# Patient Record
Sex: Female | Born: 1935 | Race: White | Hispanic: No | Smoking: Never smoker
Health system: Southern US, Community
[De-identification: ages and names within clinical notes are randomized; demographics above are authoritative.]

## PROBLEM LIST (undated history)

## (undated) DIAGNOSIS — I10 Essential (primary) hypertension: Secondary | ICD-10-CM

## (undated) DIAGNOSIS — M8580 Other specified disorders of bone density and structure, unspecified site: Secondary | ICD-10-CM

## (undated) DIAGNOSIS — K56609 Unspecified intestinal obstruction, unspecified as to partial versus complete obstruction: Secondary | ICD-10-CM

## (undated) DIAGNOSIS — E785 Hyperlipidemia, unspecified: Secondary | ICD-10-CM

## (undated) DIAGNOSIS — H409 Unspecified glaucoma: Secondary | ICD-10-CM

## (undated) DIAGNOSIS — F419 Anxiety disorder, unspecified: Secondary | ICD-10-CM

## (undated) HISTORY — DX: Anxiety disorder, unspecified: F41.9

## (undated) HISTORY — DX: Other specified disorders of bone density and structure, unspecified site: M85.80

## (undated) HISTORY — DX: Hyperlipidemia, unspecified: E78.5

## (undated) HISTORY — PX: OTHER SURGICAL HISTORY: SHX169

## (undated) HISTORY — DX: Unspecified glaucoma: H40.9

## (undated) HISTORY — DX: Unspecified intestinal obstruction, unspecified as to partial versus complete obstruction: K56.609

## (undated) HISTORY — PX: HERNIA REPAIR: SHX51

## (undated) HISTORY — DX: Essential (primary) hypertension: I10

## (undated) HISTORY — PX: ABDOMINAL HYSTERECTOMY: SHX81

---

## 1998-10-17 ENCOUNTER — Other Ambulatory Visit: Admission: RE | Admit: 1998-10-17 | Discharge: 1998-10-17 | Payer: Self-pay | Admitting: Internal Medicine

## 1999-06-16 ENCOUNTER — Encounter: Payer: Self-pay | Admitting: Emergency Medicine

## 1999-06-16 ENCOUNTER — Inpatient Hospital Stay (HOSPITAL_COMMUNITY): Admission: EM | Admit: 1999-06-16 | Discharge: 1999-06-19 | Payer: Self-pay | Admitting: Emergency Medicine

## 1999-06-19 ENCOUNTER — Inpatient Hospital Stay
Admission: EM | Admit: 1999-06-19 | Discharge: 1999-06-26 | Payer: Self-pay | Admitting: Physical Medicine and Rehabilitation

## 1999-06-22 ENCOUNTER — Ambulatory Visit (HOSPITAL_COMMUNITY)
Admission: RE | Admit: 1999-06-22 | Discharge: 1999-06-22 | Payer: Self-pay | Admitting: Physical Medicine and Rehabilitation

## 1999-10-14 ENCOUNTER — Other Ambulatory Visit: Admission: RE | Admit: 1999-10-14 | Discharge: 1999-10-14 | Payer: Self-pay | Admitting: Internal Medicine

## 1999-10-23 ENCOUNTER — Encounter: Admission: RE | Admit: 1999-10-23 | Discharge: 1999-10-23 | Payer: Self-pay | Admitting: Internal Medicine

## 1999-10-23 ENCOUNTER — Encounter: Payer: Self-pay | Admitting: Internal Medicine

## 2000-03-10 ENCOUNTER — Encounter: Admission: RE | Admit: 2000-03-10 | Discharge: 2000-03-10 | Payer: Self-pay | Admitting: Internal Medicine

## 2000-03-10 ENCOUNTER — Encounter: Payer: Self-pay | Admitting: Internal Medicine

## 2000-10-25 ENCOUNTER — Encounter: Admission: RE | Admit: 2000-10-25 | Discharge: 2000-10-25 | Payer: Self-pay | Admitting: Internal Medicine

## 2000-10-25 ENCOUNTER — Encounter: Payer: Self-pay | Admitting: Internal Medicine

## 2001-09-05 ENCOUNTER — Encounter: Payer: Self-pay | Admitting: Internal Medicine

## 2001-09-05 ENCOUNTER — Encounter: Admission: RE | Admit: 2001-09-05 | Discharge: 2001-09-05 | Payer: Self-pay | Admitting: Internal Medicine

## 2001-09-15 ENCOUNTER — Encounter: Admission: RE | Admit: 2001-09-15 | Discharge: 2001-09-15 | Payer: Self-pay | Admitting: Internal Medicine

## 2001-09-15 ENCOUNTER — Encounter: Payer: Self-pay | Admitting: Internal Medicine

## 2001-10-24 ENCOUNTER — Encounter: Admission: RE | Admit: 2001-10-24 | Discharge: 2001-10-24 | Payer: Self-pay | Admitting: Internal Medicine

## 2001-10-24 ENCOUNTER — Encounter: Payer: Self-pay | Admitting: Internal Medicine

## 2002-06-19 ENCOUNTER — Encounter: Payer: Self-pay | Admitting: Internal Medicine

## 2002-06-19 ENCOUNTER — Encounter: Admission: RE | Admit: 2002-06-19 | Discharge: 2002-06-19 | Payer: Self-pay | Admitting: Internal Medicine

## 2002-11-15 ENCOUNTER — Encounter: Payer: Self-pay | Admitting: Internal Medicine

## 2002-11-15 ENCOUNTER — Encounter: Admission: RE | Admit: 2002-11-15 | Discharge: 2002-11-15 | Payer: Self-pay | Admitting: Internal Medicine

## 2003-11-15 ENCOUNTER — Encounter: Payer: Self-pay | Admitting: Cardiology

## 2003-11-15 ENCOUNTER — Inpatient Hospital Stay (HOSPITAL_COMMUNITY): Admission: EM | Admit: 2003-11-15 | Discharge: 2003-11-24 | Payer: Self-pay | Admitting: Emergency Medicine

## 2003-11-16 ENCOUNTER — Encounter (INDEPENDENT_AMBULATORY_CARE_PROVIDER_SITE_OTHER): Payer: Self-pay | Admitting: *Deleted

## 2004-01-09 ENCOUNTER — Encounter: Admission: RE | Admit: 2004-01-09 | Discharge: 2004-01-09 | Payer: Self-pay | Admitting: Internal Medicine

## 2004-01-17 ENCOUNTER — Encounter: Admission: RE | Admit: 2004-01-17 | Discharge: 2004-01-17 | Payer: Self-pay | Admitting: Internal Medicine

## 2004-07-02 ENCOUNTER — Ambulatory Visit (HOSPITAL_COMMUNITY): Admission: RE | Admit: 2004-07-02 | Discharge: 2004-07-02 | Payer: Self-pay

## 2004-08-15 ENCOUNTER — Inpatient Hospital Stay (HOSPITAL_COMMUNITY): Admission: RE | Admit: 2004-08-15 | Discharge: 2004-08-20 | Payer: Self-pay | Admitting: General Surgery

## 2004-08-22 ENCOUNTER — Emergency Department (HOSPITAL_COMMUNITY): Admission: EM | Admit: 2004-08-22 | Discharge: 2004-08-23 | Payer: Self-pay | Admitting: Emergency Medicine

## 2004-09-29 ENCOUNTER — Encounter: Admission: RE | Admit: 2004-09-29 | Discharge: 2004-09-29 | Payer: Self-pay | Admitting: Internal Medicine

## 2004-11-13 ENCOUNTER — Encounter: Admission: RE | Admit: 2004-11-13 | Discharge: 2004-11-13 | Payer: Self-pay | Admitting: Internal Medicine

## 2004-12-10 ENCOUNTER — Encounter: Admission: RE | Admit: 2004-12-10 | Discharge: 2004-12-10 | Payer: Self-pay | Admitting: Internal Medicine

## 2005-03-05 ENCOUNTER — Encounter: Admission: RE | Admit: 2005-03-05 | Discharge: 2005-03-05 | Payer: Self-pay | Admitting: Internal Medicine

## 2005-05-22 IMAGING — CR DG CHEST 2V
2 series · 2 of 2 positions shown · non-contrast
Comparison: 01/09/04.

CLINICAL DATA: Recent ventral hernia repair.  Shortness of breath and chest pain.
 TWO VIEW CHEST:

[view not recorded (1 of 2)]
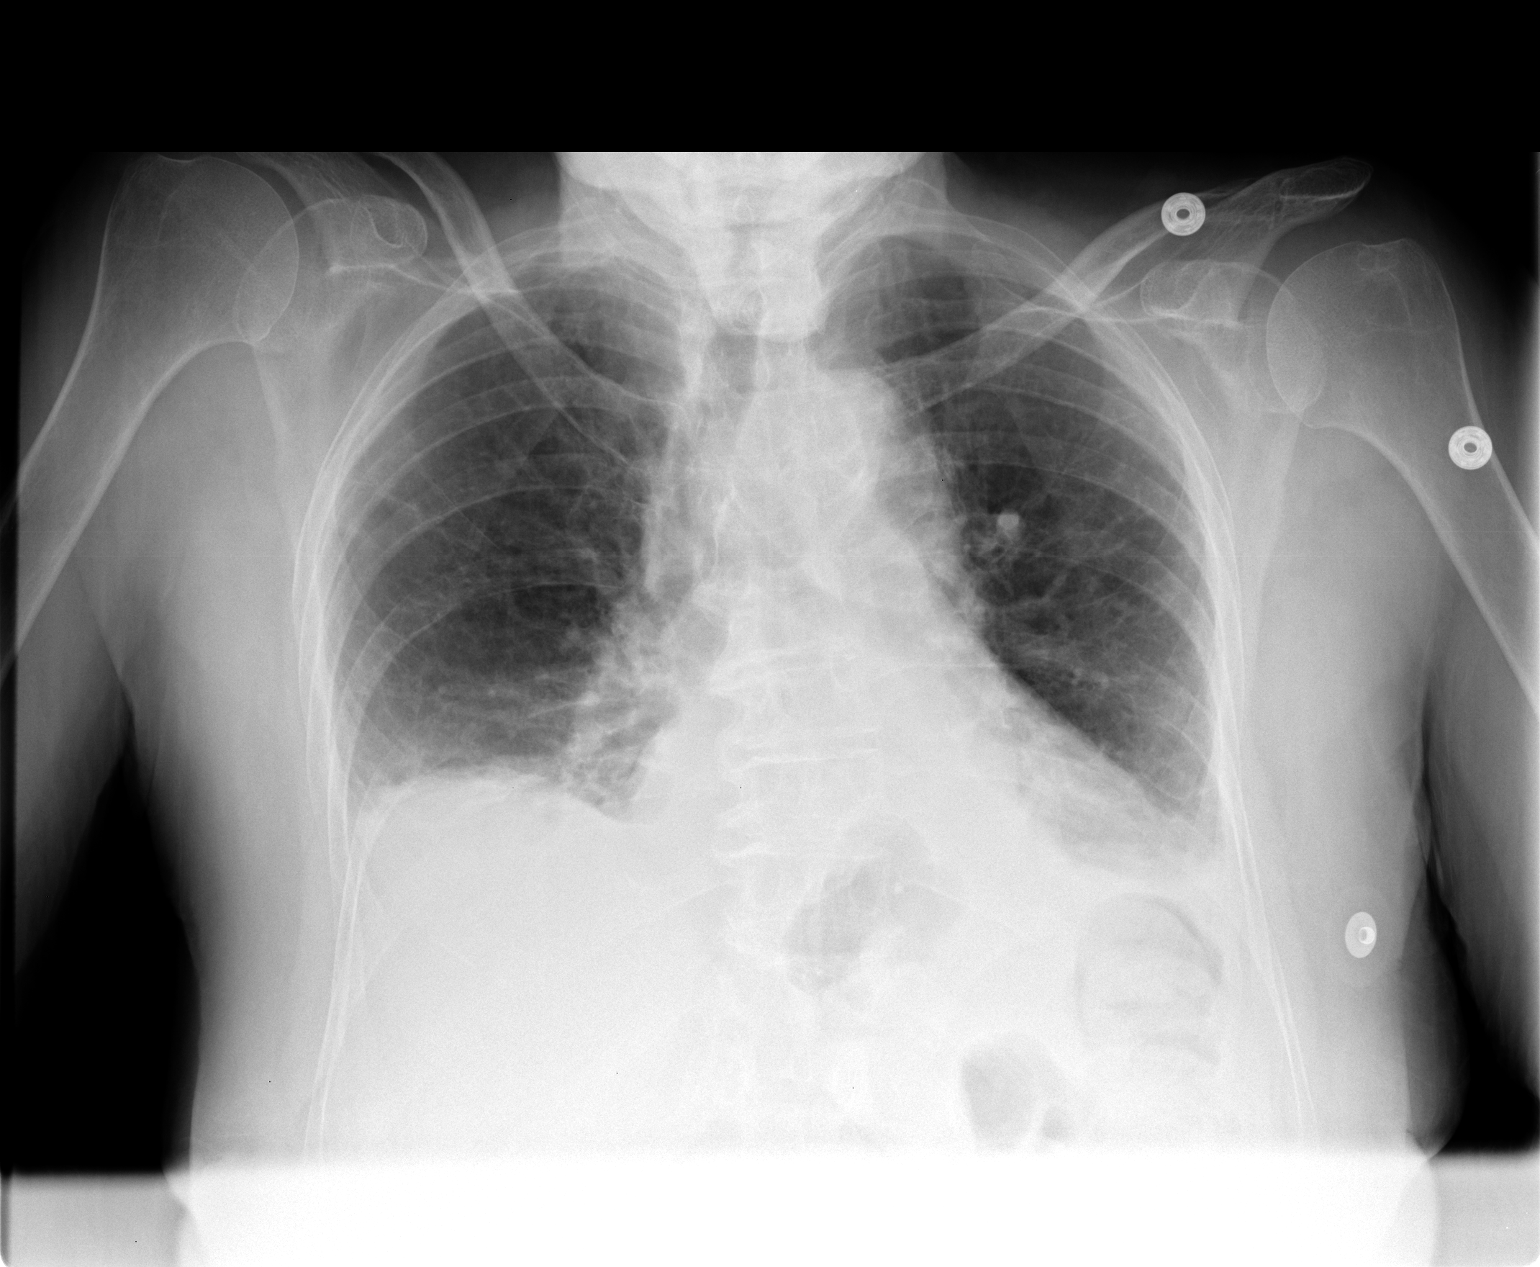

[view not recorded (2 of 2)]
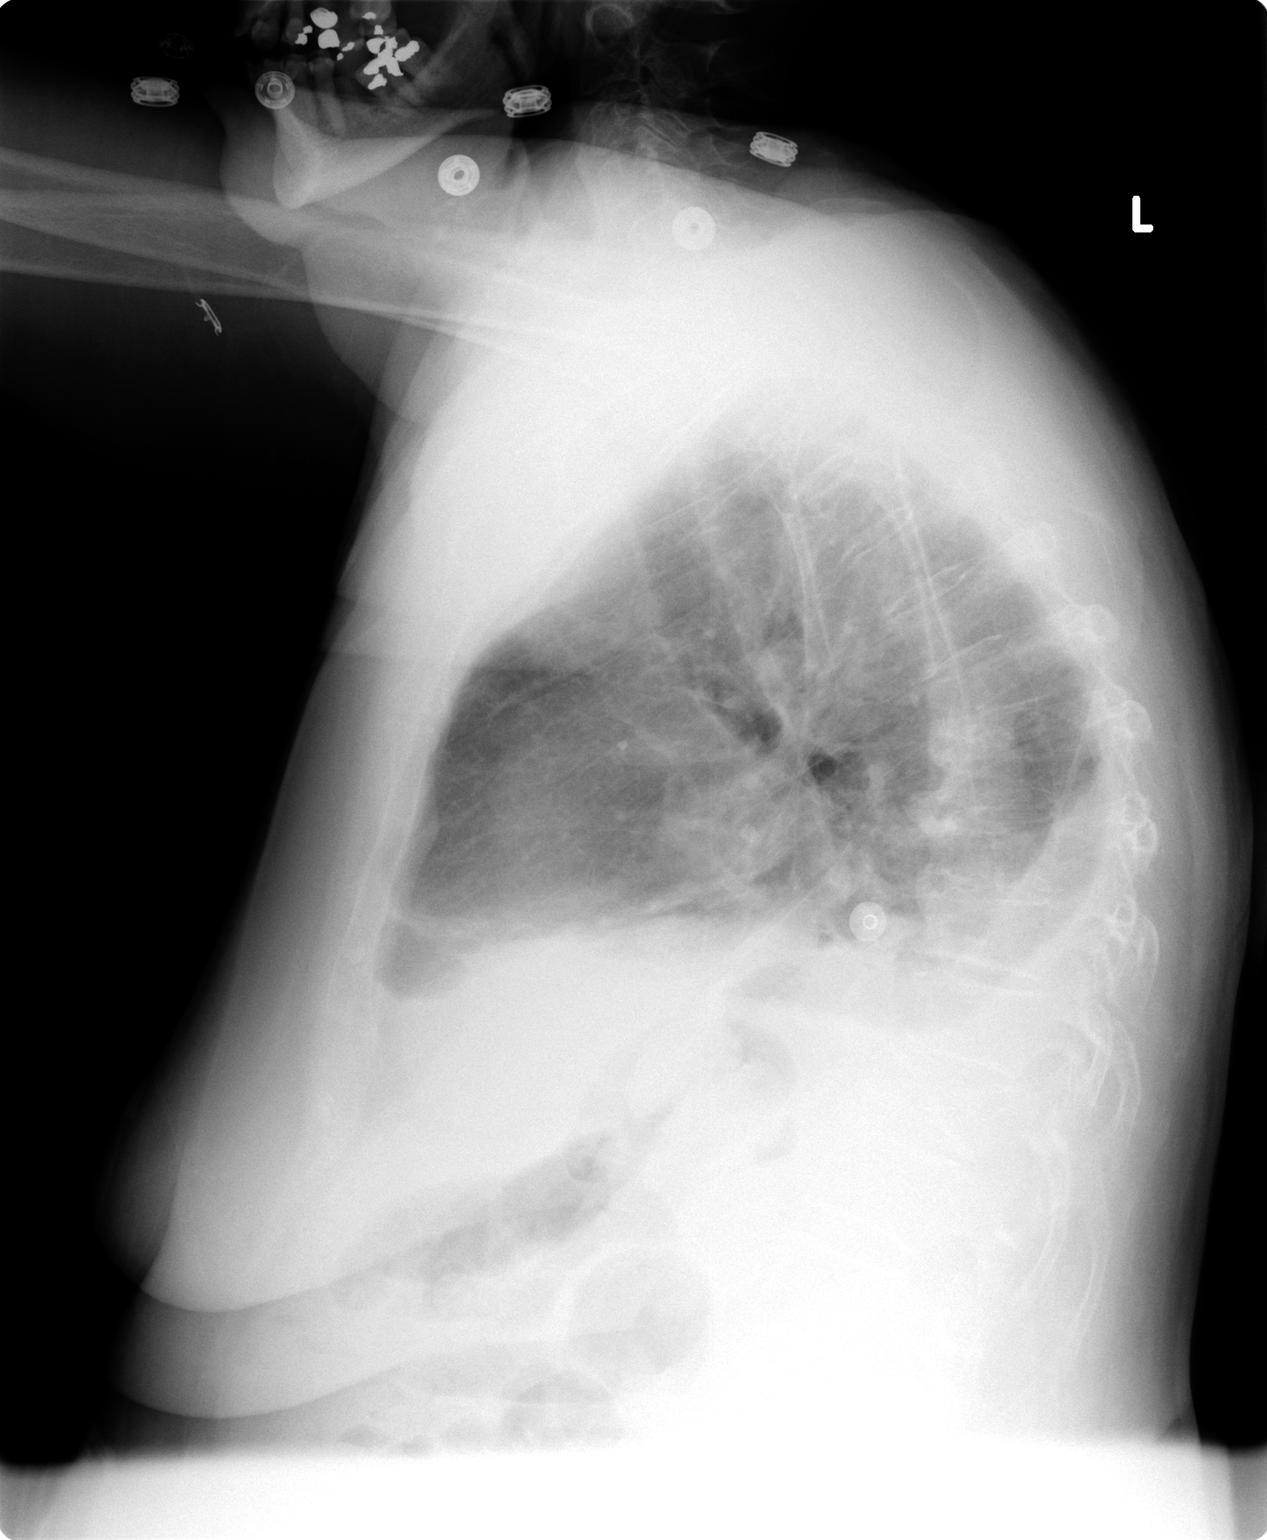

[2 of 2 positions shown; findings below may reference images not displayed]

Two view exam of the chest shows interval decrease in lung volumes with bibasilar atelectasis and small bilateral pleural effusions.  Cardio/pericardial silhouette is enlarged and accentuated by the lower volume film.  Bones are diffusely osteopenic.
IMPRESSION: 1.  Cardiomegaly with bibasilar atelectasis and small bilateral pleural effusions.  
 2.  No overt pulmonary edema.

## 2005-07-05 IMAGING — CR DG CHEST 2V
2 series · 2 of 2 positions shown · non-contrast
Comparison: 2 view chest x-ray 08/22/2004 [HOSPITAL].

CLINICAL DATA: Followup pleural effusions. Shortness of breath.

CHEST - 2 VIEW 09/29/2004:

[view not recorded (1 of 2)]
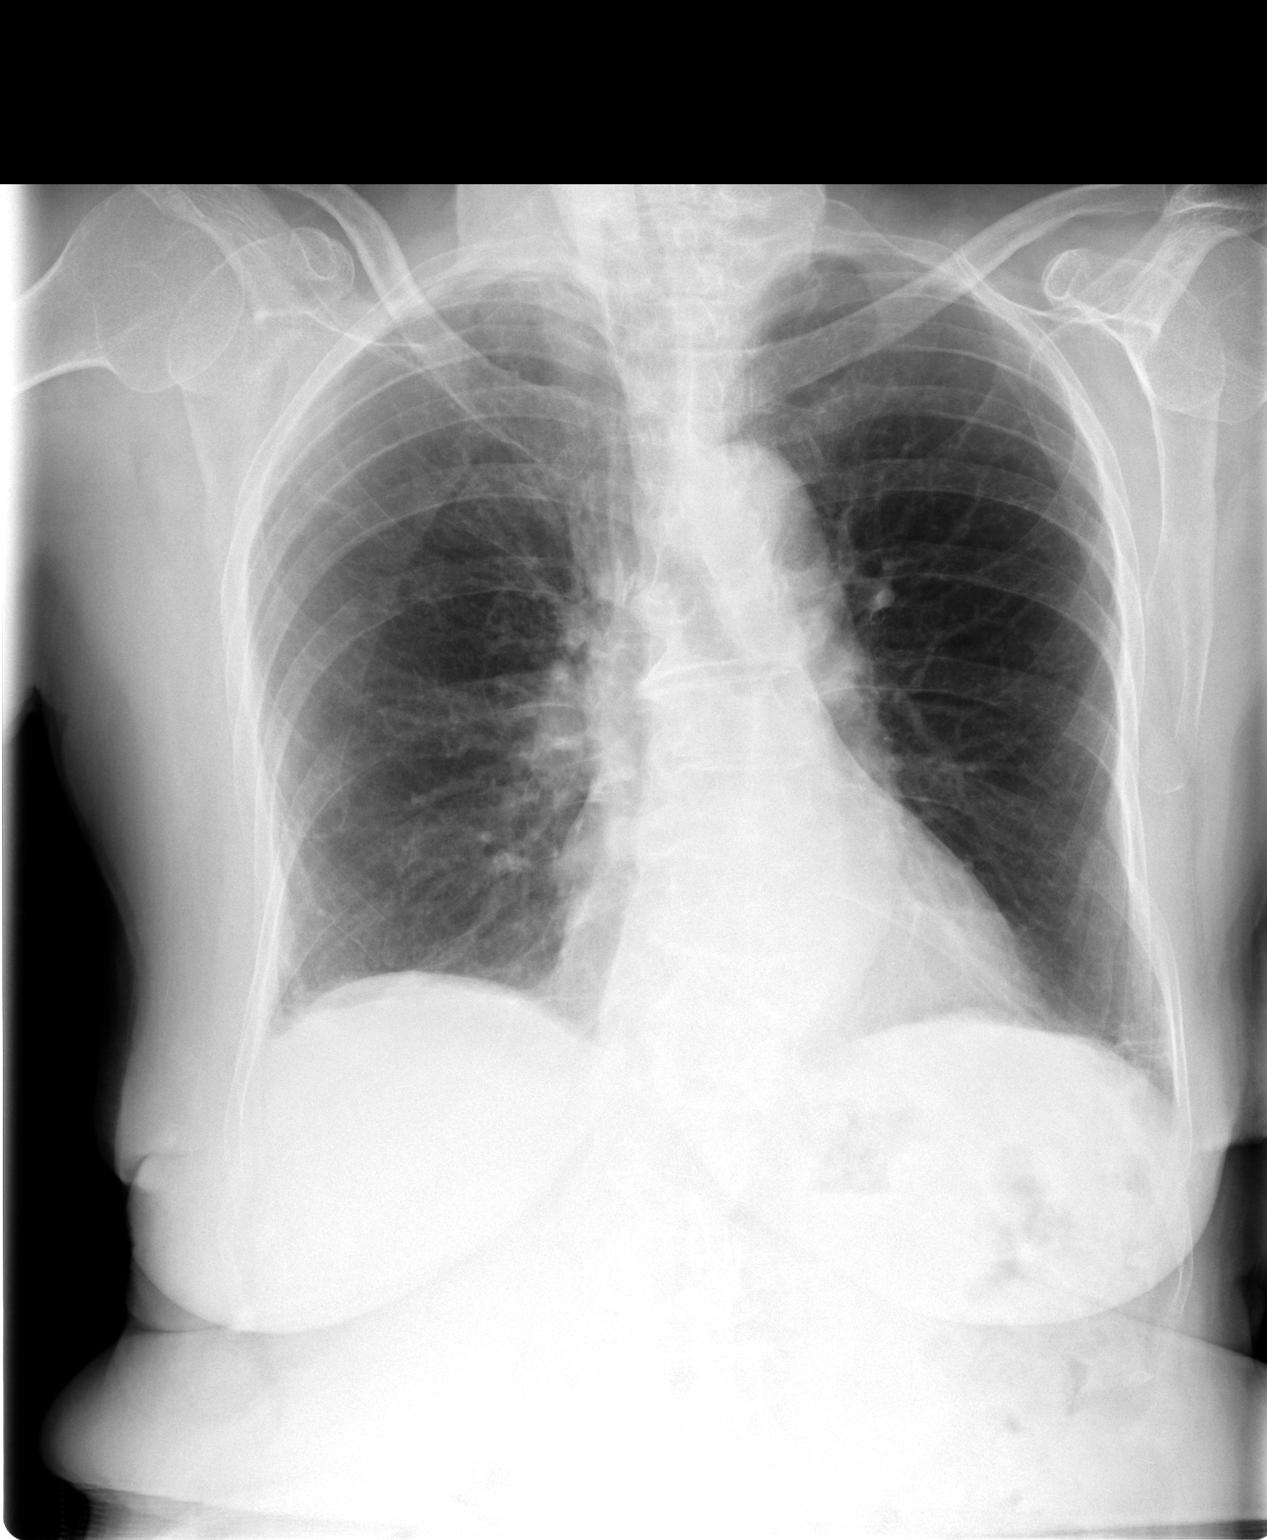

[view not recorded (2 of 2)]
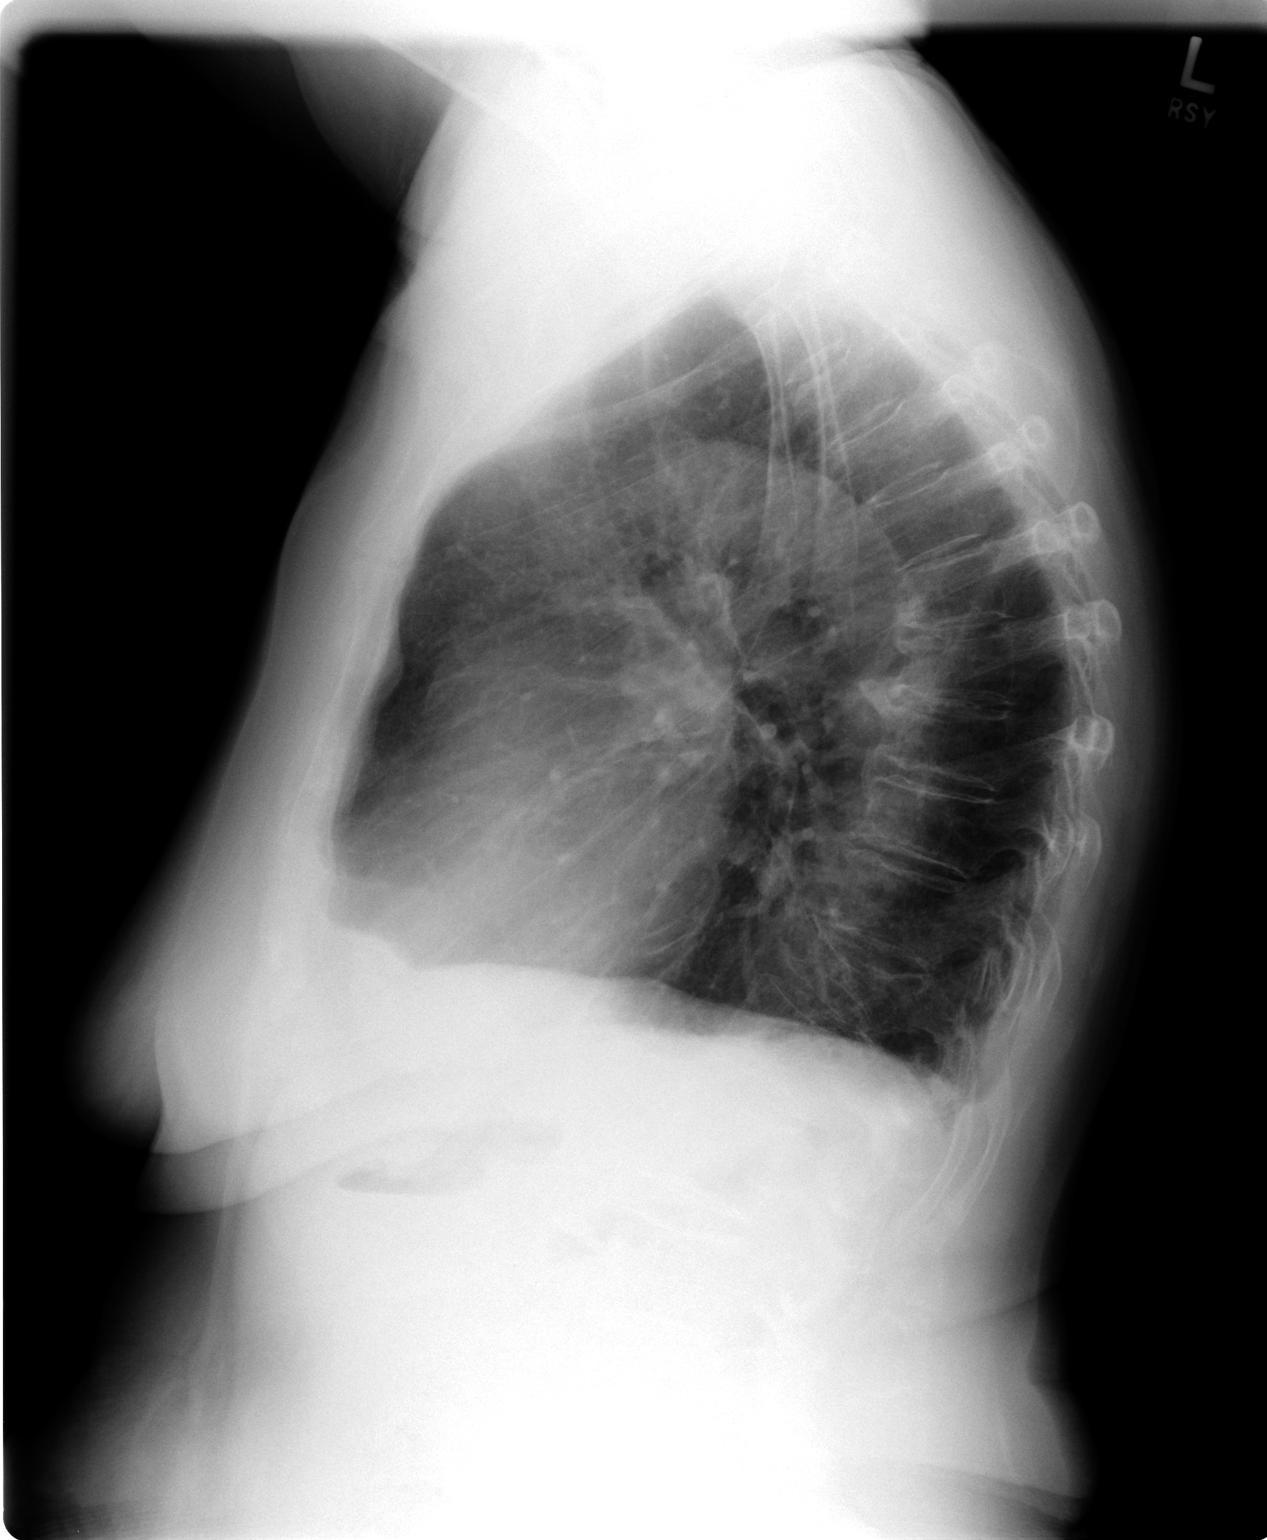

[2 of 2 positions shown; findings below may reference images not displayed]

FINDINGS: Since the previous examination, the bilateral pleural effusions have
improved, with only very small effusions persisting. Aeration of the lung bases
has also improved. There is minimal residual right basilar atelectasis. Linear
scarring is noted in the left base. The lungs appear clear otherwise. The heart
is mildly enlarged but stable. The thoracic aorta is atherosclerotic and
tortuous but unchanged. The hilar and mediastinal contours are otherwise
unremarkable. Mild thoracic scoliosis convex left is noted and there are
degenerative changes in the midthoracic spine.
IMPRESSION: Improvement in the bilateral pleural effusions and associated atelectasis in the
lower lobes since 08/22/2004. Very small effusions persist as does mild
atelectasis in the right lower lobe. No new abnormalities.

## 2005-12-17 ENCOUNTER — Encounter: Admission: RE | Admit: 2005-12-17 | Discharge: 2005-12-17 | Payer: Self-pay | Admitting: Internal Medicine

## 2005-12-21 ENCOUNTER — Encounter: Admission: RE | Admit: 2005-12-21 | Discharge: 2006-01-14 | Payer: Self-pay | Admitting: Internal Medicine

## 2006-05-05 ENCOUNTER — Encounter: Admission: RE | Admit: 2006-05-05 | Discharge: 2006-05-05 | Payer: Self-pay | Admitting: Internal Medicine

## 2007-04-21 ENCOUNTER — Encounter: Admission: RE | Admit: 2007-04-21 | Discharge: 2007-04-21 | Payer: Self-pay | Admitting: Internal Medicine

## 2007-06-13 ENCOUNTER — Encounter: Admission: RE | Admit: 2007-06-13 | Discharge: 2007-06-13 | Payer: Self-pay | Admitting: Internal Medicine

## 2008-04-29 ENCOUNTER — Ambulatory Visit: Payer: Self-pay | Admitting: Internal Medicine

## 2008-07-16 ENCOUNTER — Encounter: Admission: RE | Admit: 2008-07-16 | Discharge: 2008-07-16 | Payer: Self-pay | Admitting: Internal Medicine

## 2008-09-05 ENCOUNTER — Ambulatory Visit: Payer: Self-pay | Admitting: Internal Medicine

## 2008-10-29 ENCOUNTER — Ambulatory Visit: Payer: Self-pay | Admitting: Internal Medicine

## 2008-11-06 ENCOUNTER — Emergency Department (HOSPITAL_COMMUNITY): Admission: EM | Admit: 2008-11-06 | Discharge: 2008-11-06 | Payer: Self-pay | Admitting: Emergency Medicine

## 2008-11-26 ENCOUNTER — Ambulatory Visit: Payer: Self-pay | Admitting: Internal Medicine

## 2009-03-06 ENCOUNTER — Ambulatory Visit: Payer: Self-pay | Admitting: Internal Medicine

## 2009-05-13 ENCOUNTER — Ambulatory Visit: Payer: Self-pay | Admitting: Internal Medicine

## 2009-05-13 ENCOUNTER — Encounter: Admission: RE | Admit: 2009-05-13 | Discharge: 2009-05-13 | Payer: Self-pay | Admitting: Internal Medicine

## 2009-05-16 ENCOUNTER — Ambulatory Visit: Payer: Self-pay | Admitting: Internal Medicine

## 2009-05-19 ENCOUNTER — Ambulatory Visit: Payer: Self-pay | Admitting: Internal Medicine

## 2009-05-23 ENCOUNTER — Ambulatory Visit: Payer: Self-pay | Admitting: Internal Medicine

## 2009-06-06 ENCOUNTER — Ambulatory Visit: Payer: Self-pay | Admitting: Internal Medicine

## 2009-08-01 ENCOUNTER — Encounter: Admission: RE | Admit: 2009-08-01 | Discharge: 2009-08-01 | Payer: Self-pay | Admitting: Internal Medicine

## 2009-08-01 ENCOUNTER — Ambulatory Visit: Payer: Self-pay | Admitting: Internal Medicine

## 2009-08-13 ENCOUNTER — Encounter: Admission: RE | Admit: 2009-08-13 | Discharge: 2009-08-13 | Payer: Self-pay | Admitting: Internal Medicine

## 2009-08-13 LAB — HM MAMMOGRAPHY

## 2009-08-19 ENCOUNTER — Encounter: Admission: RE | Admit: 2009-08-19 | Discharge: 2009-10-13 | Payer: Self-pay | Admitting: Internal Medicine

## 2009-09-05 ENCOUNTER — Ambulatory Visit: Payer: Self-pay | Admitting: Internal Medicine

## 2009-09-05 ENCOUNTER — Encounter: Admission: RE | Admit: 2009-09-05 | Discharge: 2009-09-05 | Payer: Self-pay | Admitting: Internal Medicine

## 2009-09-08 ENCOUNTER — Encounter: Admission: RE | Admit: 2009-09-08 | Discharge: 2009-09-08 | Payer: Self-pay | Admitting: Internal Medicine

## 2009-09-08 ENCOUNTER — Ambulatory Visit: Payer: Self-pay | Admitting: Internal Medicine

## 2009-09-29 ENCOUNTER — Ambulatory Visit: Payer: Self-pay | Admitting: Internal Medicine

## 2009-10-07 ENCOUNTER — Ambulatory Visit: Payer: Self-pay | Admitting: Internal Medicine

## 2010-10-08 ENCOUNTER — Ambulatory Visit (INDEPENDENT_AMBULATORY_CARE_PROVIDER_SITE_OTHER): Payer: Medicare Other | Admitting: Internal Medicine

## 2010-10-08 DIAGNOSIS — J040 Acute laryngitis: Secondary | ICD-10-CM

## 2010-10-27 ENCOUNTER — Ambulatory Visit (INDEPENDENT_AMBULATORY_CARE_PROVIDER_SITE_OTHER): Payer: Medicare Other | Admitting: Internal Medicine

## 2010-10-27 ENCOUNTER — Ambulatory Visit
Admission: RE | Admit: 2010-10-27 | Discharge: 2010-10-27 | Disposition: A | Discharge status: Home / Self Care | Payer: Medicare Other | Source: Ambulatory Visit | Attending: Internal Medicine | Admitting: Internal Medicine

## 2010-10-27 ENCOUNTER — Other Ambulatory Visit: Payer: Self-pay | Admitting: Internal Medicine

## 2010-10-27 DIAGNOSIS — R1110 Vomiting, unspecified: Secondary | ICD-10-CM

## 2010-10-27 DIAGNOSIS — K5900 Constipation, unspecified: Secondary | ICD-10-CM
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
## 2010-11-20 NOTE — Op Note (Signed)
NAME:  Dominique [name], Dominique [name]                    ACCOUNT NO.:  [id number]   MEDICAL RECORD NO.:  [id number]          PATIENT TYPE:  OBV   LOCATION:  0098                         FACILITY:  [facility]   PHYSICIAN:  Adolph Pollack, M.D.DATE OF BIRTH:  05-Aug-1913   DATE OF PROCEDURE:  08/14/2004  DATE OF DISCHARGE:                                 OPERATIVE REPORT   PREOPERATIVE DIAGNOSIS:  Giant ventral incisional hernia.   POSTOPERATIVE DIAGNOSIS:  Giant ventral incisional hernia.   PROCEDURE:  Laparoscopic converted to open ventral incisional hernia repair  with mesh.   SURGEON:  Dr. Abbey Chatters   ASSISTANT:  Dr. Orson Slick   ANESTHESIA:  General.   INDICATION:  [name] is an 77 year old female, who had an emergency  exploratory laparotomy and small bowel resection in May 2005 by Dr.  Magnus Ivan.  She developed a ventral incisional hernia.  It has been somewhat  symptomatic.  She now presents for elective repair.  The procedure and risks  were discussed with her preoperatively.   TECHNIQUE:  She was seen in the holding area and then brought to the  operating room, placed supine on the operating table, and a general  anesthetic was administered.  A Foley catheter placed in the bladder.  The  abdominal wall was sterilely prepped and draped.  In the right upper  quadrant laterally, an incision was made through the skin, subcutaneous  tissue, and fascia.  The peritoneal cavity was entered bluntly.  A  pursestring suture of 0 Vicryl was placed around the fascial edges.  A  pursestring suture of 0 Vicryl was placed around the fascial edges.  The  Hasson trocar was introduced into the peritoneal cavity, and  pneumoperitoneum was created by insufflation of CO2 gas.   Next, the laparoscope was introduced.  There were fairly dense omental  adhesion and small intestinal adhesions to the midline abdominal wall.  I  placed a 5 mm trocar in the right lower quadrant under direct vision.  I  began to try to  manipulate the adhesions, but they were very densely  adherent, especially the intestinal lesions.  At this point, I did not feel  I could proceed with the laparoscopic repair and aborted it.  The  laparoscopic instruments were removed.  The trocars were removed; the right  upper quadrant fascial defect was closed by tightening up and tying down the  pursestring suture.   Following this, I reincised her lower midline incision and extended it above  the umbilicus.  There was very thin tissue.  I carried this down through the  thin scar and entered the peritoneal cavity through a large hernia sac.  There were fairly dense adhesions between the omentum and the posterior  abdominal wall which were taken down sharply and with cautery.  There were  also very dense adhesions between the colon and small bowel and abdominal,  taken down sharply.  A few serosal tears were made, and these were repaired  with interrupted 3-0 Vicryl suture.  After approximately an hour of  adhesiolysis, I had all the adhesions  removed from the abdominal wall and  exposed a very large hernia, measuring greater than 100 sq cm.  I raised the  musculofascial flaps in all directions.  I then primarily closed the hernia  with a running #1 Novofil suture.  Following this, a piece of onlay  polyester mesh was placed over the repair and anchored to the fascia  approximately 6 cm lateral, superior, and inferior to the primary repair  using running 0 Prolene suture.  This provided more than adequate coverage  of the defect as well as adequate overlap.  There was no tension on the  repair.  There was a little bit of bunching inferiorly with the primary  repair that had been performed.   The area was then irrigated, and no bleeding was noted.  The previous 5 mm  wound in the right lower quadrant was used to place a drain into the  subcutaneous space.  Another stab wound was made in the left lower quadrant,  and a second drain was  placed in the subcutaneous space.  The drains were  then anchored to the skin with interrupted 3-0 nylon suture.   The subcutaneous tissue was then closed over the mesh and drains with  running Vicryl suture.  The skin incisions were then closed with staples.  And the drains were attached to suction.  Sterile dressings were applied.   She tolerated the procedure well without any apparent complications.  She  subsequently was extubated and taken to the recovery room in satisfactory  condition.      TJR/MEDQ  D:  08/14/2004  T:  08/14/2004  Job:  [id number]   cc:   Luanna Cole. Lenord Fellers, M.D.  [address], [address]  [place]  [place] [address]  Fax: [phone]
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
## 2010-11-20 NOTE — Consult Note (Signed)
NAME:  Dominique [name], Dominique [name]                    ACCOUNT NO.:  [id number]   MEDICAL RECORD NO.:  [id number]          PATIENT TYPE:  INP   LOCATION:  0355                         FACILITY:  [facility]   PHYSICIAN:  Danae Chen, M.D.DATE OF BIRTH:  [birth year]   DATE OF CONSULTATION:  08/18/2004  DATE OF DISCHARGE:                                   CONSULTATION   PRIMARY PHYSICIAN REQUESTING CONSULTATION:  Dr. Avel Peace, [facility].   REASON FOR CONSULTATION:  Wheezing.   HISTORY OF PRESENT HOSPITAL COURSE:  The patient is a pleasant 77 year old  elderly white female who is admitted for repair of giant ventral incisional  hernia.  She has only history of mild hypertension, constipation, and  osteopenia.  Following her surgery, she has had intermittent episodes of  continuing dyspnea with some wheezing that has been noted.  She has been  evaluated for pulmonary with spiral CT which did not reveal evidence of  such; neither did it reveal any evidence of overt heart failure or edema  that would be contributing to her wheezing.  On exam, her wheezing appears  to be only upper airway related with clear lung fields.  On questioning, the  patient reports that she has had upper airway wheezing for quite some time  and usually exacerbated by lying in bed or with heartburn.   PERTINENT LABORATORY:  Blood gas on August 16, 2004 shows a pH of 7.4,  PCO2 of 38, PO2 of 58, bicarb of 25.  Hemodynamically she is stable with CBC  hemoglobin 11.7 on August 11, 2004; 9.6 on August 16, 2004.   RECOMMENDATIONS AT THIS TIME:  1.  Aggressively treat probable reflux disease with twice-a-day proton pump      inhibitor.  2.  Use the inhalers and nebulizers as p.r.n. only.  3.  Obtain outpatient pulmonary consult for further evaluation of vocal cord      dysfunction contributing to her upper airway wheezing.  4.  The patient may benefit from nighttime CPAP use because of the vocal      cord  dysfunction.  5.  Continue present care.  Agree with early ambulation and management per      surgical team for her postoperative care.   Thank you for the opportunity to consult on patient [facility].  Will be happy  to follow along with you to ascertain if her treatment plan is helping her  wheezing.      RLK/MEDQ  D:  08/18/2004  T:  08/18/2004  Job:  [id number]

## 2010-11-20 NOTE — Op Note (Signed)
NAME:  Dominique [name], Dominique [name] NO.:  [id number]   MEDICAL RECORD NO.:  [id number]                   PATIENT TYPE:  INP   LOCATION:  5151                                 FACILITY:  [facility]   PHYSICIAN:  Abigail Miyamoto, M.D.              DATE OF BIRTH:  [birth year]   DATE OF PROCEDURE:  11/16/2003  DATE OF DISCHARGE:                                 OPERATIVE REPORT   PREOPERATIVE DIAGNOSIS:  Small-bowel obstruction.   POSTOPERATIVE DIAGNOSIS:  Small-bowel obstruction.   PROCEDURE:  1. Exploratory laparotomy.  2. Small bowel resection.   SURGEON:  Dr. Riley Lam A . Magnus Ivan.   ASSISTANT:  Dr. Lebron Conners.   ANESTHESIA:  General endotracheal anesthesia.   ESTIMATED BLOOD LOSS:  Minimal.   FINDINGS:  The patient was found to have approximately a one foot segment of  small intestines which was volvulized and completely obstructed as well as  ischemic.  It was nonperforated.  There was an area where the bowel appeared  slightly intussuscepted as well.   PROCEDURE IN DETAIL:  The patient was brought to the operating room,  identified as Dominique [name].  She was placed supine on the operating room table  and general anesthesia was induced.  Her abdomen was then prepped and draped  in the usual sterile fashion.  Using a #10 blade, a midline incision was  then created through the patient's previous old scar.  The incision was  carried down through the fascia with the electrocautery.  The peritoneum was  opened the entire length of the incision.  Upon entering the abdomen, the  patient was found to have a large amount of omentum adherent to the  peritoneal surface.  This had to be taken down slowly with the  electrocautery.  After entering the abdomen, the patient was found to have a  large amount of dark blood-tinged fluid in the pelvis.  The ischemic bowel  could be identified but could not be eviscerated at this point secondary to  adhesions.  Further lysis of  adhesions had to be finally performed with the  electrocautery and Metzenbaum scissors and finally the small bowel could be  eviscerated.  The patient was found to have an approximately one foot  segment of ileum which had volvulized on itself and around an adhesive band  and had partly intussuscepted as well.  This piece was totally ischemic and  nonviable.  An area of small bowel was transected proximal and distal to  this with the GIA75 stapler.  The mesentery was then taken down with Kelly  clamps and 2-0 silk ties, removing this piece of specimen which was then  sent to pathology for identification.  The two ends of the small bowel were  identified and they were felt to be slightly ischemic as well so they were  both resected back further with the GIA75 stapler.  Finally,  the bowel  appeared quite pink and viable.  The small bowel was reapproximated in a  side-to-side fashion with a single interrupted 3-0 silk suture.  Enterotomies were then created.  Some of the proximal small bowel contents  were suctioned out through the suction canister.  The GIA75 stapler was then  placed into each segment of opened intestines and then the two pieces of  intestines were anastomosed together in a side-to-side fashion with a single  fire of the GIA75 stapler.  The open end was then closed with a TA60  stapler.  A small stitch was then placed at the end of the suture line to  hold the tension.  The mesenteric defect was then closed with interrupted 2-  0 and 3-0 silk sutures.  A large anastomosis appeared to be created which  was quite viable in appearance.  At this point, the abdomen was copiously  irrigated with several liters of normal saline.  Hemostasis appeared to be  achieved.  There was a large amount of reaction in the retroperitoneum and  along the sigmoid colon where this piece of ischemic bowel had been.  It  appeared quite viable without evidence of perforation.  The abdomen was  again  irrigated further with normal saline.  The midline fascia was then  closed with a running #1 PDS suture.  The skin was then irrigated and closed  with skin staples.  The patient tolerated the procedure well.  All sponge,  needle and instrument counts were correct at the end of the procedure.  The  patient was then extubated in the operating room and taken in a stable  condition to the recovery room.                                               Abigail Miyamoto, M.D.    DB/MEDQ  D:  11/16/2003  T:  11/17/2003  Job:  [id number]   cc:   Luanna Cole. Lenord Fellers, M.D.  [address] [place]  [place]  [place] [address]  Fax: [phone]

## 2010-11-20 NOTE — Discharge Summary (Signed)
NAME:  [name], Dominique                    ACCOUNT NO.:  [id number]   MEDICAL RECORD NO.:  [id number]          PATIENT TYPE:  INP   LOCATION:  0355                         [facility]AC[facility]  [facility]   PHYSICIAN:  Adolph Pollack, M.D.DATE OF BIRTH:  10-14-1913   DATE OF ADMISSION:  08/14/2004  DATE OF DISCHARGE:  08/20/2004                                 DISCHARGE SUMMARY   PRINCIPAL DISCHARGE DIAGNOSIS:  Ventral incisional hernia.   SECONDARY DIAGNOSES:  1.  Hypertension.  2.  Hyperkalemia.  3.  Mild postoperative ileus.  4.  Gastroesophageal reflux disease.   REASON FOR ADMISSION:  [name] is an 77 year old female who had an  emergency exploratory laparotomy and small bowel resection May 2005 by Dr.  Abigail Miyamoto. She developed a ventral incisional hernia. She is very  functional and this is interfering with her quality of life. She requested  it be repaired and she was admitted for that.   HOSPITAL COURSE:  She underwent a laparoscopic converted to open ventral  incisional hernia because of severe adhesions. Postoperatively, she had  Jackson-Pratt drains in and was noted to be somewhat hyperkalemic with a  potassium of 5.3 with the rest of her renal function within normal limits.  She had a little bit of shortness of breath. Chest CT was negative for PE  but showed small bilateral effusions. Clear liquid diet was started along  with bronchodilators. She continued to have some wheezing and I had asked  for medical consult to see her and they thought it was secondary to  gastroesophageal reflux and started her on a proton pump inhibitor.  Hypertension was well controlled. She had a little bit of problem with  nausea, had some flatus, and I went ahead and started her on some Reglan and  this seemed to help her improve. She began to eat some, began to be able to  be mobilized. Her incision was clean and intact. On postoperative day #6 the  drains were removed and she was discharged.   DISPOSITION:  Discharged to home in satisfactory condition August 20, 2004. She is given a prescription for Prilosec, told to take Tylenol for  pain. She is told to continue her home medicines. Activity restrictions were  given to her and she will follow up with me in 1 week for staple removal.      TJR/MEDQ  D:  09/10/2004  T:  09/10/2004  Job:  [id number]   cc:   Luanna Cole. Lenord Fellers, M.D.  [address], [place]  [place]  [place] [address]  Fax: [phone]
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
## 2010-11-20 NOTE — Consult Note (Signed)
NAME:  Dominique [name], Dominique [name] NO.:  [id number]   MEDICAL RECORD NO.:  [id number]                   PATIENT TYPE:  INP   LOCATION:  1827                                 FACILITY:  [facility]   PHYSICIAN:  Jimmye Norman III, M.D.               DATE OF BIRTH:  1914/06/18   DATE OF CONSULTATION:  11/15/2003  DATE OF DISCHARGE:                                   CONSULTATION   REASON FOR CONSULTATION:  Thank you very much for asking me to see Ms. Dominique [name], an 77 year old female who had been previously healthy, who has had 2-3  days of abdominal discomfort, nausea, and vomiting.   I was actually called while I was in the operating room for this patient,  who, at that time, was in the emergency room after being evaluated for a  possible bowel obstruction.  I reviewed her x-rays, which indeed do show  distended small bowel loops with air fluid levels, but there is also some  gas in the colon, and what appears to be a lot of stool in her colon.  There  is no free air.  I subsequently came up to examine the patient, who was  actively vomiting thick bile at the time of her initial evaluation by me.   After clearing her vomitus, an NG tube was requested, and should be put to  low continuous suction.   PREVIOUS SURGERY:  She had a bladder suspension in 1995 with an umbilical  hernia repair at that time, and a previous hysterectomy without oophorectomy  in 1965.  She has had no previous surgeries for bowel obstruction.   MEDICATIONS:  She takes no medications by her account, except for a 25 mg  pill, which she cannot disclose to me now.   ALLERGIES:  No known drug allergies.   PHYSICAL EXAMINATION:  ABDOMEN:  On my examination now, she has a distended  abdomen, but there are hyperactive bowel sounds, without any peritoneal  findings, rebound, or guarding.  She is distended, however.  RECTAL:  Not performed.  LUNGS:  Clear to auscultation.   LABORATORY STUDIES:  Labs  demonstrated what would be expected with some  margination secondary to vomiting and dehydration.  She is slightly  hemoconcentrated with a hemoglobin of 14.   IMPRESSION:  My impression is that she does have at least a high-grade  partial small bowel obstruction that may resolve with adequate  decompression, and possibly with an enema, seeing that the patient does have  a large amount  of distal colon stool.  We will try that for the initial 24-48 hours, and if  that is not successful, then exploratory laparotomy may be necessary to  relieve her obstruction.  Labs have been written for tomorrow, along with a  repeat abdominal film.  Kathrin Ruddy, M.D.    JW/MEDQ  D:  11/15/2003  T:  11/15/2003  Job:  [id number]   cc:   Luanna Cole. Lenord Fellers, M.D.  [address] [place]  [place]  [place] [address]  Fax: [phone]
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
## 2010-11-23 ENCOUNTER — Ambulatory Visit: Payer: Medicare Other | Admitting: Internal Medicine

## 2010-11-24 ENCOUNTER — Ambulatory Visit
Admission: RE | Admit: 2010-11-24 | Discharge: 2010-11-24 | Disposition: A | Discharge status: Home / Self Care | Payer: Medicare Other | Source: Ambulatory Visit | Attending: Internal Medicine | Admitting: Internal Medicine

## 2010-11-24 ENCOUNTER — Ambulatory Visit (INDEPENDENT_AMBULATORY_CARE_PROVIDER_SITE_OTHER): Payer: Medicare Other | Admitting: Internal Medicine

## 2010-11-24 ENCOUNTER — Encounter: Payer: Self-pay | Admitting: Internal Medicine

## 2010-11-24 VITALS — BP 126/60 | HR 76 | Temp 99.2°F | Wt 109.0 lb

## 2010-11-24 DIAGNOSIS — F0390 Unspecified dementia without behavioral disturbance: Secondary | ICD-10-CM | POA: Insufficient documentation

## 2010-11-24 DIAGNOSIS — H409 Unspecified glaucoma: Secondary | ICD-10-CM | POA: Insufficient documentation

## 2010-11-24 DIAGNOSIS — K5900 Constipation, unspecified: Secondary | ICD-10-CM | POA: Insufficient documentation

## 2010-11-24 DIAGNOSIS — R3915 Urgency of urination: Secondary | ICD-10-CM

## 2010-11-24 DIAGNOSIS — R110 Nausea: Secondary | ICD-10-CM

## 2010-11-24 DIAGNOSIS — I10 Essential (primary) hypertension: Secondary | ICD-10-CM

## 2010-11-24 LAB — COMPREHENSIVE METABOLIC PANEL
Albumin: 3.7 g/dL (ref 3.5–5.2)
Alkaline Phosphatase: 79 U/L (ref 39–117)
CO2: 24 mEq/L (ref 19–32)
Calcium: 9.1 mg/dL (ref 8.4–10.5)
Creat: 0.87 mg/dL (ref 0.40–1.20)
Sodium: 138 mEq/L (ref 135–145)
Total Bilirubin: 0.3 mg/dL (ref 0.3–1.2)
Total Protein: 6.1 g/dL (ref 6.0–8.3)

## 2010-11-24 LAB — POCT URINALYSIS DIPSTICK
Glucose, UA: NEGATIVE
Nitrite, UA: NEGATIVE
Protein, UA: NEGATIVE
Spec Grav, UA: 1.02
Urobilinogen, UA: NEGATIVE
pH, UA: 5

## 2010-11-24 LAB — CBC WITH DIFFERENTIAL/PLATELET
Basophils Relative: 0 % (ref 0–1)
Monocytes Relative: 11 % (ref 3–12)
Neutro Abs: 4.8 10*3/uL (ref 1.7–7.7)
Neutrophils Relative %: 67 % (ref 43–77)

## 2010-11-24 LAB — LIPASE: Lipase: 19 U/L (ref 0–75)

## 2010-11-24 NOTE — Patient Instructions (Signed)
Stop Calcium and Coenzyme Q-10. Use Dulcolax suppository once for relief of constipation. Bloodwork has been done today and is pending. Continue Furosemide and Prilosec.

## 2010-11-24 NOTE — Progress Notes (Signed)
  Subjective:    Patient ID: Dominique Frank, female    DOB: 1914-06-25, 77 y.o.   MRN: [id number]  HPI Was seen in late April for complaint of nausea. KUB showed moderate feces in colon so assumed she was constipated. Accompanied today by caretaker who says she had vomiting at night 2 episodes on one night about 2 weeks ago and stayed in bed for 2 days. Does not complain of recurrent nausea but memory has declined in past few years. No complaint of pain. Only prescribed med is Lasix. Takes Calcium and MVI. Pt. dnies constipation and caretaker really does not know what bowel habits pt. Has. Apparently had 3 episodes of diarrhea recently. No fever or chills but urine is cloudy and urine culture will be done.    Review of Systems     Objective:   Physical Exam Abdomen is soft with ventral hernia noted. No hepatosplenomegaly. A lot of soft stool in rectal vault. Stool is guaaic negative. Chest is clear.  Cor RRR with frequent irregular contractions.       Assessment & Plan:  1- Constipation 2-Possible UTI---  Both 1 and 2 could cause nausea 3-Dementia 4-Glaucoma Plan is to perform KUB flat abd upright. Labs checked today include CBC with diff, Cmet, amylase and lipase. Would prefer not to be aggressive in work up in this 77 year old female.

## 2010-11-27 LAB — URINE CULTURE

## 2010-11-27 MED ORDER — LEVOFLOXACIN 250 MG PO TABS
250.0000 mg | ORAL_TABLET | Freq: Every day | ORAL | Status: AC
Start: 2010-11-27 — End: 2010-12-04

## 2010-11-27 NOTE — Progress Notes (Signed)
Addended by: Daisy Blossom on: 11/27/2010 12:43 PM   Modules accepted: Orders

## 2011-08-30 IMAGING — CR DG ABDOMEN 2V
2 series · 2 of 2 positions shown · non-contrast
Comparison: 10/27/2010

CLINICAL DATA: Nausea.

ABDOMEN - 2 VIEW

[view not recorded (1 of 2)]
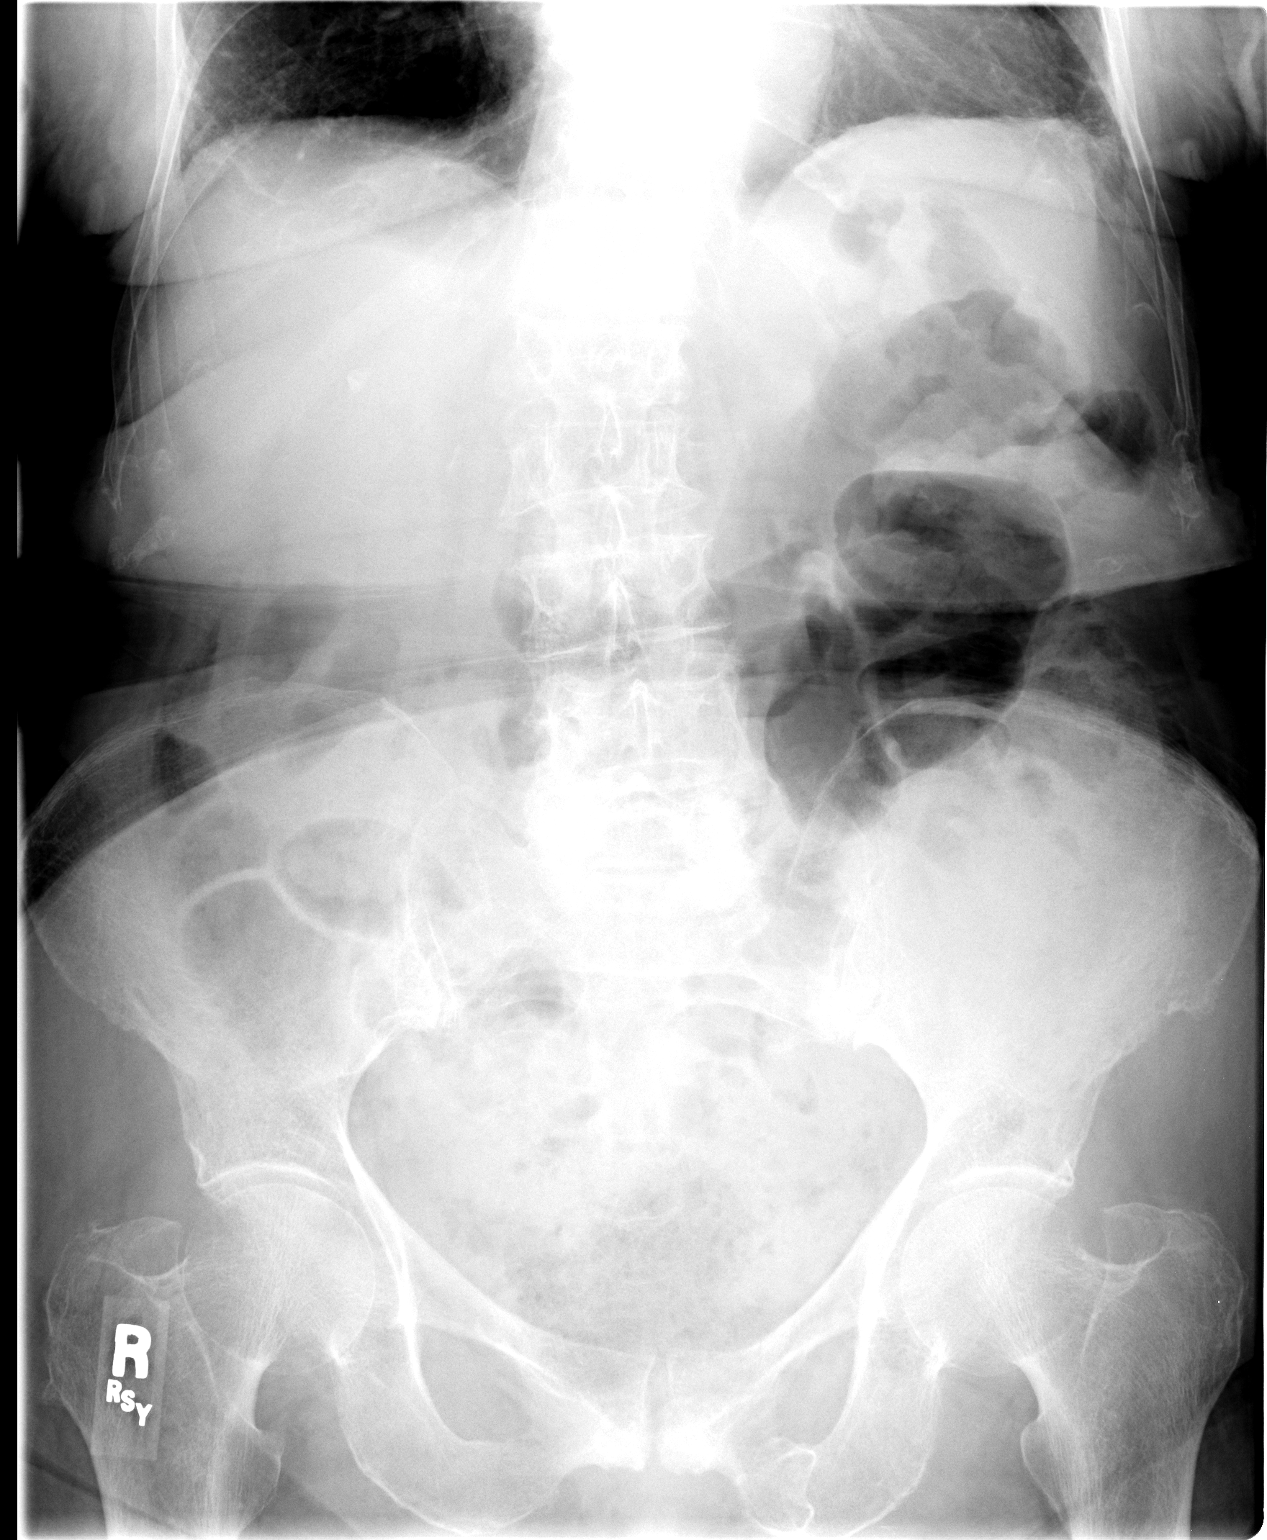

[view not recorded (2 of 2)]
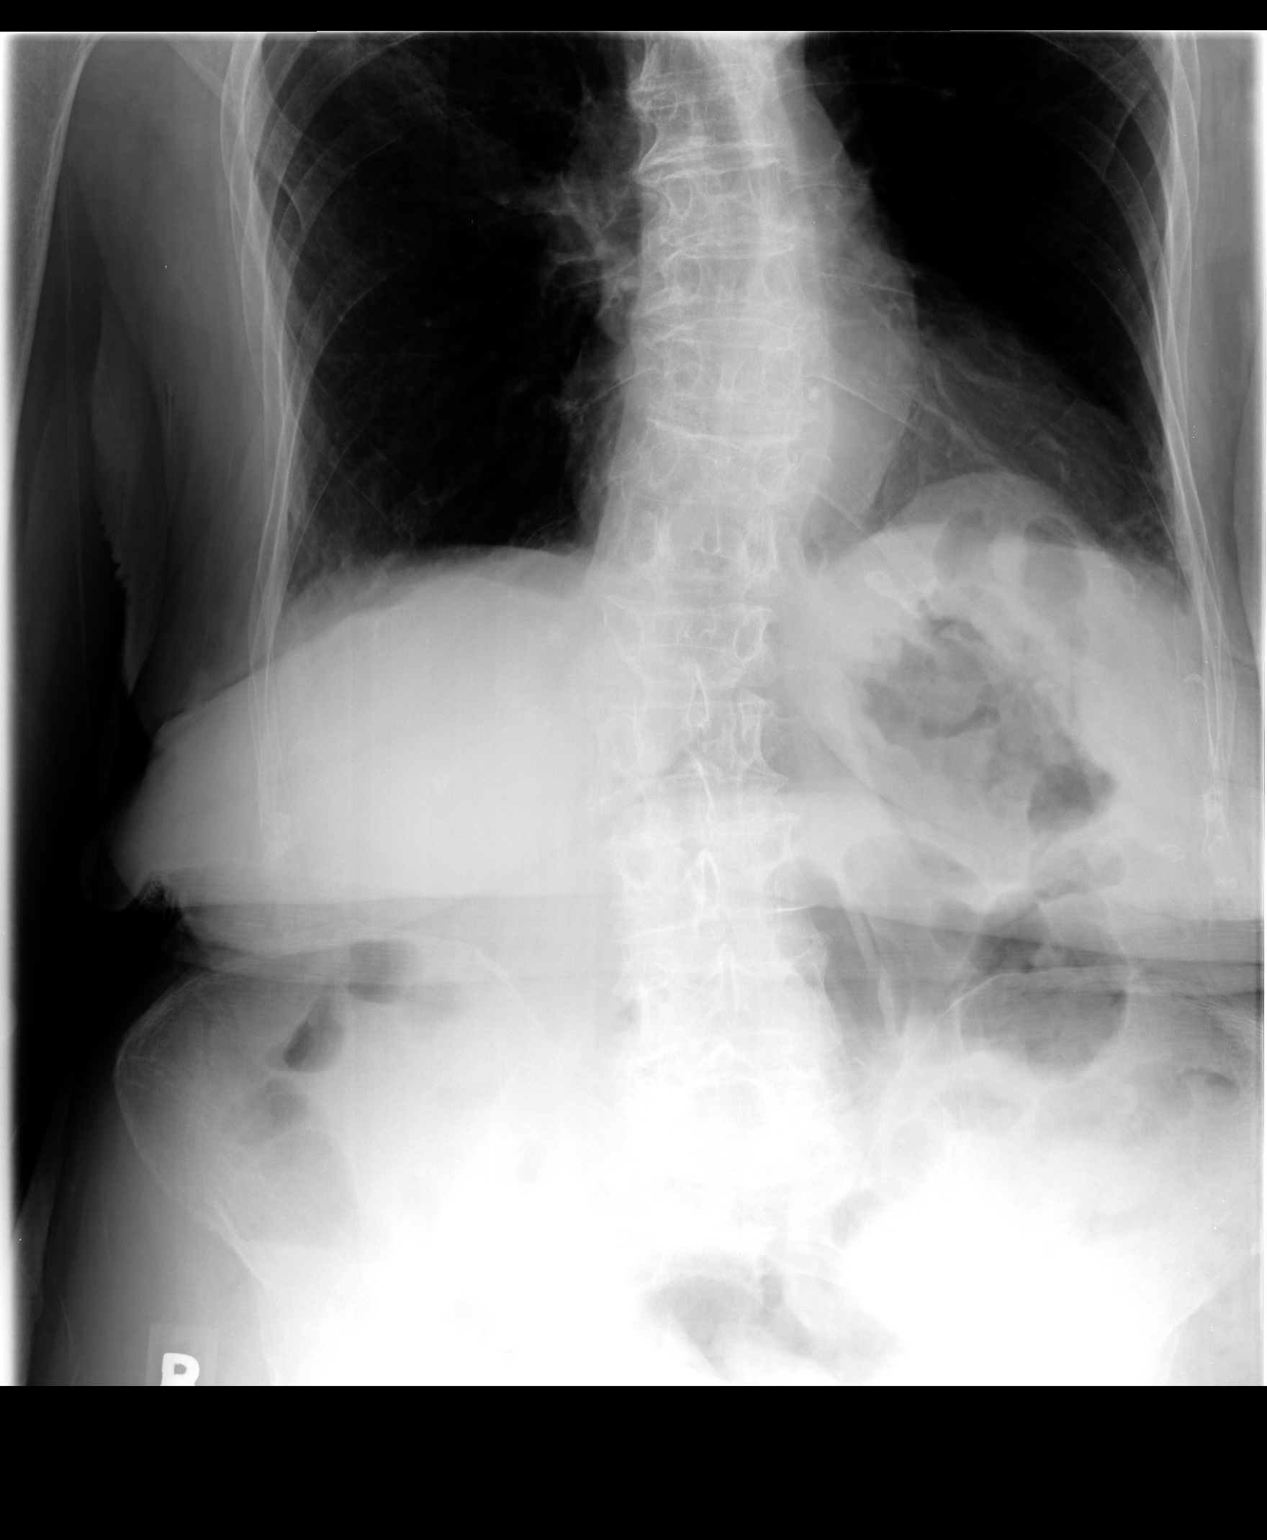

[2 of 2 positions shown; findings below may reference images not displayed]

FINDINGS: There is a nonobstructive bowel gas pattern.  Moderate
stool burden in the left side of the colon.  No organomegaly,
suspicious calcification, or free air.  Old left inferior pubic
ramus fracture again noted, unchanged.  No acute bony abnormality.
IMPRESSION: No acute findings.

## 2011-10-28 ENCOUNTER — Encounter: Payer: Self-pay | Admitting: Internal Medicine

## 2011-10-28 ENCOUNTER — Ambulatory Visit (INDEPENDENT_AMBULATORY_CARE_PROVIDER_SITE_OTHER): Payer: Medicare Other | Admitting: Internal Medicine

## 2011-10-28 VITALS — BP 164/78 | HR 64 | Temp 98.5°F | Ht 60.0 in | Wt 114.0 lb

## 2011-10-28 DIAGNOSIS — J069 Acute upper respiratory infection, unspecified: Secondary | ICD-10-CM

## 2011-10-28 DIAGNOSIS — I10 Essential (primary) hypertension: Secondary | ICD-10-CM

## 2011-10-28 DIAGNOSIS — F0390 Unspecified dementia without behavioral disturbance: Secondary | ICD-10-CM

## 2011-10-28 NOTE — Progress Notes (Deleted)
Subjective:     Patient ID: Dominique Frank, female   DOB: 1914/05/29, 78 y.o.   MRN: [id number]  HPI   Review of Systems     Objective:   Physical Exam     Assessment:     ***    Plan:     ***

## 2011-10-28 NOTE — Patient Instructions (Signed)
Take amoxicillin 500 mg 3 times daily for 10 days. Call if not better in 2 weeks.

## 2011-10-28 NOTE — Progress Notes (Signed)
  Subjective:    Patient ID: Dominique Frank, female    DOB: [birth year], 78 y.o.   MRN: [id number]  HPI 78 year old white female with history of cough and wheezing according to caretaker. They have had a fever. Symptoms started around April 19. Had decreased appetite. Apparently had yellow sputum production. Patient has dementia and doesn't remember much about the illness and denies coughing. I do not hear her cough today. Caretaker is adamant that patient had a fairly serious cough and wheezing. Patient has a history of hypertension and dependent edema. Other than that she's in good health aside from the dementia.    Review of Systems     Objective:   Physical Exam chest clear to auscultation; cardiac exam regular rate and rhythm; TMs are clear; pharynx is clear; mucous membranes are moist. Extremities without edema.        Assessment & Plan:  URI  Hypertension  Dementia  Plan: Amoxicillin 500 mg D.O. 3 times daily for 10 days.

## 2012-03-14 ENCOUNTER — Ambulatory Visit (INDEPENDENT_AMBULATORY_CARE_PROVIDER_SITE_OTHER): Payer: Medicare Other | Admitting: Internal Medicine

## 2012-03-14 ENCOUNTER — Encounter: Payer: Self-pay | Admitting: Internal Medicine

## 2012-03-14 ENCOUNTER — Ambulatory Visit
Admission: RE | Admit: 2012-03-14 | Discharge: 2012-03-14 | Disposition: A | Discharge status: Home / Self Care | Payer: Medicare Other | Source: Ambulatory Visit | Attending: Internal Medicine | Admitting: Internal Medicine

## 2012-03-14 VITALS — BP 166/84 | Temp 98.9°F | Ht <= 58 in | Wt 112.0 lb

## 2012-03-14 DIAGNOSIS — R05 Cough: Secondary | ICD-10-CM

## 2012-03-14 MED ORDER — CEFTRIAXONE SODIUM 1 G IJ SOLR
1.0000 g | Freq: Once | INTRAMUSCULAR | Status: AC
  Administered 2012-03-14: 1 g via INTRAMUSCULAR

## 2012-03-15 LAB — COMPREHENSIVE METABOLIC PANEL
ALT: 11 U/L (ref 0–35)
AST: 19 U/L (ref 0–37)
Alkaline Phosphatase: 87 U/L (ref 39–117)
BUN: 6 mg/dL (ref 6–23)
Creat: 0.61 mg/dL (ref 0.50–1.10)
Glucose, Bld: 100 mg/dL — ABNORMAL HIGH (ref 70–99)
Potassium: 4.1 mEq/L (ref 3.5–5.3)
Total Bilirubin: 0.3 mg/dL (ref 0.3–1.2)

## 2012-03-15 LAB — CBC WITH DIFFERENTIAL/PLATELET
Basophils Absolute: 0 10*3/uL (ref 0.0–0.1)
Eosinophils Absolute: 0.1 10*3/uL (ref 0.0–0.7)
HCT: 38.5 % (ref 36.0–46.0)
Lymphocytes Relative: 32 % (ref 12–46)
MCH: 29.8 pg (ref 26.0–34.0)
MCV: 85.6 fL (ref 78.0–100.0)
Monocytes Absolute: 0.8 10*3/uL (ref 0.1–1.0)
Monocytes Relative: 18 % — ABNORMAL HIGH (ref 3–12)
Neutro Abs: 2.2 10*3/uL (ref 1.7–7.7)
Platelets: 265 10*3/uL (ref 150–400)

## 2012-03-15 LAB — TSH: TSH: 3.156 u[IU]/mL (ref 0.350–4.500)

## 2012-03-21 ENCOUNTER — Encounter: Payer: Self-pay | Admitting: Internal Medicine

## 2012-03-21 ENCOUNTER — Other Ambulatory Visit: Payer: Medicare Other | Admitting: Internal Medicine

## 2012-03-21 ENCOUNTER — Ambulatory Visit (INDEPENDENT_AMBULATORY_CARE_PROVIDER_SITE_OTHER): Payer: Medicare Other | Admitting: Internal Medicine

## 2012-03-21 VITALS — BP 160/72 | HR 80 | Temp 98.2°F | Wt 110.0 lb

## 2012-03-21 DIAGNOSIS — E860 Dehydration: Secondary | ICD-10-CM

## 2012-03-21 DIAGNOSIS — R630 Anorexia: Secondary | ICD-10-CM

## 2012-03-21 DIAGNOSIS — R5383 Other fatigue: Secondary | ICD-10-CM

## 2012-03-21 DIAGNOSIS — J069 Acute upper respiratory infection, unspecified: Secondary | ICD-10-CM

## 2012-03-21 LAB — BASIC METABOLIC PANEL
CO2: 27 mEq/L (ref 19–32)
Glucose, Bld: 99 mg/dL (ref 70–99)
Potassium: 4.7 mEq/L (ref 3.5–5.3)
Sodium: 134 mEq/L — ABNORMAL LOW (ref 135–145)

## 2012-03-21 NOTE — Progress Notes (Signed)
  Subjective:    Patient ID: Dominique Frank, female    DOB: [birth year], 78 y.o.   MRN: [id number]  HPI 78 year old white female seen recently for decreased appetite cough and congestion. Sodium was 129. This was consistent with mild volume depletion. She is not on a diuretic at this point in time. Takes aspirin and Prilosec daily. Family has noted decrease in appetite and sleeping a lot. Her TSH and B12 levels were normal. Liver functions and serum proteins were normal. Accompanied today by her niece. Last time accompanied by her son. Has pretty much full-time caretaker at home with her. Chest x-ray showed no heart failure or pneumonia at last visit. She will soon be 78 years old. They say her memory is failing but she identifies me and knows my name.   Review of Systems     Objective:   Physical Exam HEENT exam: Mucous membranes are moist. TMs are clear. Neck is supple without thyromegaly. Chest clear to auscultation. Cardiac exam regular rate and rhythm 1/6 systolic ejection murmur with occasional extrasystole. Extremity without edema.        Assessment & Plan:  Mild hyponatremia  Decreased appetite  Recent URI-appears to be resolving. Have not heard her cough in the office today. Although family says she has congested cough still.  Plan: B- met drawn today in followup of hyponatremia. Start Megace 40 mg / cc (300 cc) 5 cc by mouth twice daily. 1 cc vitamin B12 IM given to see if energy will improve. Recheck in 2 weeks. Family mentioned getting Hospice in to care for her at last visit. I do not think she is ready for hospice.

## 2012-03-21 NOTE — Patient Instructions (Addendum)
Start Megace 5 cc twice daily. Return in 2 weeks.

## 2012-04-02 ENCOUNTER — Encounter: Payer: Self-pay | Admitting: Internal Medicine

## 2012-04-02 NOTE — Progress Notes (Signed)
  Subjective:    Patient ID: Dominique Frank, female    DOB: 09-29-1913, 78 y.o.   MRN: [id number]  HPI 78 year old white female who resides independently with help at home in today accompanied by one of her sons and her caretaker. Has had a recent cough. Decreased oral intake and decreased appetite. Has to be reminded to eat. Has some mild dementia. She is alert and knows my name. Is very pleasant. She is not taking diuretic. Used to take diuretic for hypertension. Blood pressure is a bit elevated today but I think she is anxious about being here. Seems to have had recent upper respiratory infection with cough and congestion. No fever or shaking chills.    Review of Systems     Objective:   Physical Exam neck is supple without JVD thyromegaly or carotid bruits. Chest clear to auscultation. Cardiac exam regular rate and rhythm normal S1 and S2. Extremities without edema. Skin is warm and dry.        Assessment & Plan:  URI  Decreased appetite  Volume depletion  Mild dementia-likely affects eating and drinking  Plan: Had 20 minute conversation with her son today regarding her situation. He thinks hospice needs to be called in. I do not think the patient is anywhere near ready for hospice and told him so. I am treating her with a Zithromax Z-Pak 2 tablets day one followed by 1 tablet days 2 through 5 for upper respiratory infection. He is to speak with caretaker about making sure she stays hydrated and eats 3 times daily. We have drawn CBC with differential, C-met, TSH and B12 levels.  Addendum: B-met consistent with volume depletion. Increase po fluids.

## 2012-04-02 NOTE — Patient Instructions (Addendum)
Increase fluid consumption. Make sure patient eats 3 times daily. Encourage oral intake. Take Zithromax Z-Pak 2 tablets day one followed by 1 tablet days 2 through 5. Lab work drawn and is pending

## 2012-04-03 ENCOUNTER — Encounter (HOSPITAL_COMMUNITY): Payer: Self-pay | Admitting: Family Medicine

## 2012-04-03 ENCOUNTER — Telehealth: Payer: Self-pay | Admitting: Internal Medicine

## 2012-04-03 ENCOUNTER — Emergency Department (HOSPITAL_COMMUNITY): Payer: Medicare Other

## 2012-04-03 ENCOUNTER — Observation Stay (HOSPITAL_COMMUNITY)
Admission: EM | Admit: 2012-04-03 | Discharge: 2012-04-05 | Disposition: A | Discharge status: Home / Self Care | Payer: Medicare Other | Attending: Family Medicine | Admitting: Family Medicine

## 2012-04-03 DIAGNOSIS — Z23 Encounter for immunization: Secondary | ICD-10-CM | POA: Insufficient documentation

## 2012-04-03 DIAGNOSIS — E860 Dehydration: Secondary | ICD-10-CM

## 2012-04-03 DIAGNOSIS — K6289 Other specified diseases of anus and rectum: Secondary | ICD-10-CM

## 2012-04-03 DIAGNOSIS — F0390 Unspecified dementia without behavioral disturbance: Secondary | ICD-10-CM | POA: Diagnosis present

## 2012-04-03 DIAGNOSIS — K5641 Fecal impaction: Secondary | ICD-10-CM

## 2012-04-03 DIAGNOSIS — K5649 Other impaction of intestine: Principal | ICD-10-CM | POA: Insufficient documentation

## 2012-04-03 DIAGNOSIS — I10 Essential (primary) hypertension: Secondary | ICD-10-CM | POA: Diagnosis present

## 2012-04-03 DIAGNOSIS — E871 Hypo-osmolality and hyponatremia: Secondary | ICD-10-CM

## 2012-04-03 DIAGNOSIS — K5900 Constipation, unspecified: Secondary | ICD-10-CM | POA: Diagnosis present

## 2012-04-03 DIAGNOSIS — H409 Unspecified glaucoma: Secondary | ICD-10-CM | POA: Diagnosis present

## 2012-04-03 LAB — COMPREHENSIVE METABOLIC PANEL
ALT: 9 U/L (ref 0–35)
Albumin: 3.5 g/dL (ref 3.5–5.2)
BUN: 19 mg/dL (ref 6–23)
Calcium: 9.2 mg/dL (ref 8.4–10.5)
Chloride: 95 mEq/L — ABNORMAL LOW (ref 96–112)
Creatinine, Ser: 0.73 mg/dL (ref 0.50–1.10)
GFR calc Af Amer: 82 mL/min — ABNORMAL LOW (ref >90)
GFR calc non Af Amer: 70 mL/min — ABNORMAL LOW (ref >90)
Glucose, Bld: 89 mg/dL (ref 70–99)
Potassium: 3.6 mEq/L (ref 3.5–5.1)

## 2012-04-03 LAB — OCCULT BLOOD, POC DEVICE: Fecal Occult Bld: NEGATIVE

## 2012-04-03 LAB — CBC WITH DIFFERENTIAL/PLATELET
Eosinophils Absolute: 0.1 10*3/uL (ref 0.0–0.7)
Lymphocytes Relative: 19 % (ref 12–46)
Neutrophils Relative %: 70 % (ref 43–77)
Platelets: 298 10*3/uL (ref 150–400)
WBC: 7.9 10*3/uL (ref 4.0–10.5)

## 2012-04-03 MED ORDER — ACETAMINOPHEN 650 MG RE SUPP: 650.0000 mg | Freq: Four times a day (QID) | RECTAL | Status: DC | PRN

## 2012-04-03 MED ORDER — ASPIRIN EC 81 MG PO TBEC
81.0000 mg | DELAYED_RELEASE_TABLET | Freq: Every day | ORAL | Status: DC
  Administered 2012-04-03 – 2012-04-05 (×3): 81 mg via ORAL
  Filled 2012-04-03 (×3): qty 1

## 2012-04-03 MED ORDER — ADULT MULTIVITAMIN W/MINERALS CH
1.0000 | ORAL_TABLET | Freq: Every day | ORAL | Status: DC
  Administered 2012-04-04 – 2012-04-05 (×2): 1 via ORAL
  Filled 2012-04-03 (×2): qty 1

## 2012-04-03 MED ORDER — BRIMONIDINE TARTRATE 0.2 % OP SOLN
1.0000 [drp] | Freq: Two times a day (BID) | OPHTHALMIC | Status: DC
  Administered 2012-04-03 – 2012-04-05 (×4): 1 [drp] via OPHTHALMIC
  Filled 2012-04-03: qty 5

## 2012-04-03 MED ORDER — INFLUENZA VIRUS VACC SPLIT PF IM SUSP
0.5000 mL | INTRAMUSCULAR | Status: AC
  Administered 2012-04-04: 0.5 mL via INTRAMUSCULAR
  Filled 2012-04-03: qty 0.5

## 2012-04-03 MED ORDER — FLEET ENEMA 7-19 GM/118ML RE ENEM
1.0000 | ENEMA | Freq: Once | RECTAL | Status: AC
  Administered 2012-04-03: 1 via RECTAL
  Filled 2012-04-03: qty 1

## 2012-04-03 MED ORDER — POLYETHYLENE GLYCOL 3350 17 G PO PACK
68.0000 g | PACK | Freq: Once | ORAL | Status: AC
  Administered 2012-04-03: 68 g via ORAL
  Filled 2012-04-03: qty 4

## 2012-04-03 MED ORDER — TRAVOPROST (BAK FREE) 0.004 % OP SOLN
1.0000 [drp] | Freq: Every day | OPHTHALMIC | Status: DC
  Administered 2012-04-03 – 2012-04-04 (×2): 1 [drp] via OPHTHALMIC
  Filled 2012-04-03: qty 2.5

## 2012-04-03 MED ORDER — ASPIRIN 81 MG PO TBEC: 81.0000 mg | DELAYED_RELEASE_TABLET | Freq: Every day | ORAL | Status: DC

## 2012-04-03 MED ORDER — SENNA 8.6 MG PO TABS
2.0000 | ORAL_TABLET | Freq: Every day | ORAL | Status: DC
  Administered 2012-04-03: 17.2 mg via ORAL
  Filled 2012-04-03 (×2): qty 2

## 2012-04-03 MED ORDER — MULTI-VITAMIN/MINERALS PO TABS: 1.0000 | ORAL_TABLET | Freq: Every day | ORAL | Status: DC

## 2012-04-03 MED ORDER — TIMOLOL MALEATE 0.5 % OP SOLN
1.0000 [drp] | Freq: Two times a day (BID) | OPHTHALMIC | Status: DC
  Administered 2012-04-03 – 2012-04-05 (×4): 1 [drp] via OPHTHALMIC
  Filled 2012-04-03: qty 5

## 2012-04-03 MED ORDER — SODIUM CHLORIDE 0.9 % IV SOLN
INTRAVENOUS | Status: DC
  Administered 2012-04-03 – 2012-04-04 (×2): via INTRAVENOUS

## 2012-04-03 MED ORDER — ENOXAPARIN SODIUM 40 MG/0.4ML ~~LOC~~ SOLN
40.0000 mg | SUBCUTANEOUS | Status: DC
  Administered 2012-04-03: 40 mg via SUBCUTANEOUS
  Filled 2012-04-03 (×4): qty 0.4

## 2012-04-03 MED ORDER — ACETAMINOPHEN 325 MG PO TABS: 650.0000 mg | ORAL_TABLET | Freq: Four times a day (QID) | ORAL | Status: DC | PRN

## 2012-04-03 MED ORDER — BRIMONIDINE TARTRATE-TIMOLOL 0.2-0.5 % OP SOLN: 1.0000 [drp] | Freq: Two times a day (BID) | OPHTHALMIC | Status: DC

## 2012-04-03 MED ORDER — DOCUSATE SODIUM 100 MG PO CAPS
100.0000 mg | ORAL_CAPSULE | Freq: Two times a day (BID) | ORAL | Status: DC
  Administered 2012-04-03: 100 mg via ORAL
  Filled 2012-04-03 (×3): qty 1

## 2012-04-03 NOTE — H&P (Signed)
[facility] History and Physical  Dominique Frank ZOX:[id number] DOB: Feb 11, 1914 DOA: 04/03/2012   PCP: Margaree Mackintosh, MD   Chief Complaint:  Chief Complaint  Patient presents with  . Rectal Pain     HPI: Dominique Frank is a 78 y.o. female who was brought to the emergency room by her family today because she has not had a bowel movement in 4 days and she was complaining of rectal pain. They also noticed that she was weaker and that she seemed dehydrated. In the emergency room the patient was found to have severe fecal impaction, hyponatremia and she was referred for observation in the hospital.   Review of Systems: The patient denies anorexia, fever, weight loss,, vision loss, decreased hearing, hoarseness, chest pain, syncope, dyspnea on exertion, peripheral edema, balance deficits, hemoptysis,  melena, hematochezia, severe indigestion/heartburn, hematuria, incontinence, genital sores, muscle weakness, suspicious skin lesions, transient blindness,   Past Medical History  Diagnosis Date  . Hyperlipidemia   . Hypertension   . Osteopenia   . Constipation   . Anxiety    Past Surgical History  Procedure Date  . Abdominal hysterectomy   . Hernia repair   . Vag burch suspension    Social History:  reports that she has never smoked. She quit smokeless tobacco use about 8 years ago. She reports that she does not drink alcohol or use illicit drugs. Patient lives at home with 24-hour supervision  Allergies  Allergen Reactions  . Codeine Nausea Only   Family history is noncontributory secondary to patient's advanced age  Prior to Admission medications   Medication Sig Start Date End Date Taking? Authorizing Provider  aspirin 81 MG EC tablet Take 81 mg by mouth daily.     Yes Historical Provider, MD  calcium carbonate (OS-CAL) 1250 MG chewable tablet Chew 1 tablet by mouth daily.     Yes Historical Provider, MD  COMBIGAN 0.2-0.5 % ophthalmic solution Place 1 drop into both eyes.   11/18/10  Yes Historical Provider, MD  Multiple Vitamins-Minerals (MULTIVITAMIN WITH MINERALS) tablet Take 1 tablet by mouth daily.     Yes Historical Provider, MD  polyethylene glycol (MIRALAX / GLYCOLAX) packet Take 17 g by mouth daily. Every other day    Yes Historical Provider, MD  TRAVATAN Z 0.004 % ophthalmic solution Place 1 drop into both eyes at bedtime.  11/18/10  Yes Historical Provider, MD   Physical Exam: Filed Vitals:   04/03/12 1127 04/03/12 1849  BP: 148/77 161/71  Pulse: 87 78  Temp: 98.9 F (37.2 C)   TempSrc: Oral   Resp: 16   SpO2: 97% 98%     General:  Alert and oriented to son  Eyes: Pupil equal round react to light accommodation, extraocular movement is intact  ENT: Dry oral mucosa, no pharyngeal exudates  Neck: No jugular venous distention, no thyromegaly  Cardiovascular: Regular rate and rhythm without murmurs rubs or gallops  Respiratory: Clear to auscultation bilaterally without wheezes rhonchi crackles  Abdomen: Distended, soft, nontender, bowel sounds are present  Skin: Warm dry no suspicious skin rashes  Musculoskeletal: Intact,  Psychiatric: Euthymic  Neurologic: Mildly confused, cranial nerves 2-12 intact, strength 5 out of 5 in the lower extremities, sensation intact  Labs on Admission:  Basic Metabolic Panel:  Lab 04/03/12 8119  NA 127*  K 3.6  CL 95*  CO2 20  GLUCOSE 89  BUN 19  CREATININE 0.73  CALCIUM 9.2  MG --  PHOS --   Liver Function  Tests:  Lab 04/03/12 1522  AST 17  ALT 9  ALKPHOS 72  BILITOT 0.4  PROT 6.5  ALBUMIN 3.5   No results found for this basename: LIPASE:5,AMYLASE:5 in the last 168 hours No results found for this basename: AMMONIA:5 in the last 168 hours CBC:  Lab 04/03/12 1522  WBC 7.9  NEUTROABS 5.5  HGB 12.3  HCT 35.1*  MCV 85.8  PLT 298   Cardiac Enzymes: No results found for this basename: CKTOTAL:5,CKMB:5,CKMBINDEX:5,TROPONINI:5 in the last 168 hours  BNP (last 3 results) No  results found for this basename: PROBNP:3 in the last 8760 hours CBG: No results found for this basename: GLUCAP:5 in the last 168 hours  Radiological Exams on Admission: Dg Abd 1 View  04/03/2012  *RADIOLOGY REPORT*  Clinical Data: Abdominal pain and constipation.  ABDOMEN - 1 VIEW  Comparison: 11/24/2010.  Findings: There is a large amount of stool throughout the colon and in the rectum suggesting fecal impaction and constipation.  There are air filled loops of small bowel without significant distention. No free air.  The bony structures are stable.  Remote pelvic fractures and lumbar compression fractures are noted.  IMPRESSION: Large amount of stool throughout the colon and in the rectum suggesting fecal impaction and constipation.   Original Report Authenticated By: P. Loralie Champagne, M.D.      Assessment/Plan Principal Problem:  *Dehydration Active Problems:  Dementia  HTN (hypertension)  Constipation  Glaucoma  Hyponatremia  Fecal impaction   1. Mild dehydration with hyponatremia-plan for IV normal saline through the night and repeat a basic metabolic profile in the morning 2. Fecal impaction-patient has received a fleets enema in the emergency room. We'll continue Water enemas and give her a bowel prep with 68 g of MiraLAX. Will prescribe Colace and Senokot as well 3. Dementia-mild-continue 24-hour supervision 4. Glaucoma, continue home medications  Code Status: Full code as per son Family Communication: Son Disposition Plan: Home  Time spent: 45 minutes  Kennie Karapetian [facility] Pager [phone]  If 7PM-7AM, please contact night-coverage [email or web address] Password [email or web address] 04/03/2012, 7:09 PM

## 2012-04-03 NOTE — ED Notes (Signed)
Per pt having pressure in rectum and feel like she has to BM. Last BM yesterday and normal.

## 2012-04-03 NOTE — Telephone Encounter (Signed)
Family calling saying patient is dehydrated. She already had been scheduled for two-week followup appointment tomorrow. We have advised patient be taken to emergency department for further evaluation. Patient is now had several appointments here. At last visit, electrolytes had improved and there was no evidence of volume depletion. Family had previously indicated they wanted her enrolled in hospice but I did not see the need for that. At last visit, she was placed on Megace to increase her appetite.

## 2012-04-03 NOTE — ED Notes (Signed)
Brown liquid expelled from rectum no stool noted. Redness and tenderness noted in rectal area.

## 2012-04-04 ENCOUNTER — Ambulatory Visit: Payer: Medicare Other | Admitting: Internal Medicine

## 2012-04-04 DIAGNOSIS — K5900 Constipation, unspecified: Secondary | ICD-10-CM

## 2012-04-04 LAB — BASIC METABOLIC PANEL
CO2: 17 mEq/L — ABNORMAL LOW (ref 19–32)
Calcium: 9 mg/dL (ref 8.4–10.5)
Chloride: 103 mEq/L (ref 96–112)
Potassium: 4.2 mEq/L (ref 3.5–5.1)
Sodium: 133 mEq/L — ABNORMAL LOW (ref 135–145)

## 2012-04-04 LAB — GLUCOSE, CAPILLARY: Glucose-Capillary: 104 mg/dL — ABNORMAL HIGH (ref 70–99)

## 2012-04-04 MED ORDER — PANTOPRAZOLE SODIUM 40 MG PO TBEC: 40.0000 mg | DELAYED_RELEASE_TABLET | Freq: Every day | ORAL | Status: DC

## 2012-04-04 MED ORDER — PEG 3350-KCL-NA BICARB-NACL 420 G PO SOLR
4000.0000 mL | Freq: Once | ORAL | Status: AC
  Administered 2012-04-04: 4000 mL via ORAL
  Filled 2012-04-04: qty 4000

## 2012-04-04 MED ORDER — POLYETHYLENE GLYCOL 3350 17 G PO PACK
17.0000 g | PACK | Freq: Three times a day (TID) | ORAL | Status: DC
  Filled 2012-04-04 (×3): qty 1

## 2012-04-04 MED ORDER — SENNA 8.6 MG PO TABS
2.0000 | ORAL_TABLET | Freq: Two times a day (BID) | ORAL | Status: DC
  Administered 2012-04-05: 17.2 mg via ORAL
  Filled 2012-04-04 (×4): qty 2

## 2012-04-04 MED ORDER — SENNA 8.6 MG PO TABS
2.0000 | ORAL_TABLET | Freq: Two times a day (BID) | ORAL | Status: DC
  Administered 2012-04-04: 17.2 mg via ORAL
  Filled 2012-04-04 (×2): qty 2

## 2012-04-04 MED ORDER — POLYETHYLENE GLYCOL 3350 17 G PO PACK
17.0000 g | PACK | Freq: Three times a day (TID) | ORAL | Status: DC
  Administered 2012-04-05: 17 g via ORAL
  Filled 2012-04-04 (×5): qty 1

## 2012-04-04 MED ORDER — SODIUM CHLORIDE 0.9 % IV SOLN
INTRAVENOUS | Status: DC
  Administered 2012-04-04: 21:00:00 via INTRAVENOUS

## 2012-04-04 NOTE — Progress Notes (Signed)
Brief Nutrition Note:  Pt answered "unsure" to "Have you recently lost weight without trying?" generating a MST (Malnutrition Screening Tool) score of 2.  Wt Readings from Last 5 Encounters:  04/03/12 113 lb 15.7 oz (51.7 kg)  03/21/12 110 lb (49.896 kg)  03/14/12 112 lb (50.803 kg)  10/28/11 114 lb (51.71 kg)  11/24/10 109 lb (49.442 kg)   Weight has been stable x 4 months.   Current diet is Heart Healthy, pt consumed 85% of last meal.   Chart reviewed, no nutrition interventions warranted at this time, please consult as needed.   Clarene Duke RD, LDN Pager [phone] After Hours pager [phone]

## 2012-04-04 NOTE — Progress Notes (Signed)
Utilization review complete 

## 2012-04-04 NOTE — Progress Notes (Signed)
PATIENT DETAILS Name: Dominique Frank Age: 78 y.o. Sex: female Date of Birth: [birth year] Admit Date: 04/03/2012 Admitting Physician Sorin Luanne Bras, MD ZOX:[id number],WUJW J, MD  Subjective: Only minimal BM today am  Assessment/Plan: Principal Problem:  *Dehydration -resolved with IVF -Saline lock later today  Active Problems:  Hyponatremia -2/2 to above -Na better  Fecal Impaction -no response to Miralx, 2 enemas -will try Miralex Golytely -belly is soft -no SBO on Xray   Dementia -at baseline   HTN (hypertension) -start Low dose Amlodipine   Glaucoma -c/w current meds  Disposition: Remain inpatient  DVT Prophylaxis: Prophylactic Lovenox   Code Status: Full code   Procedures:  None  CONSULTS:  None  PHYSICAL EXAM: Vital signs in last 24 hours: Filed Vitals:   04/03/12 1849 04/03/12 1959 04/03/12 2135 04/04/12 0438  BP: 161/71 159/83 121/72 174/82  Pulse: 78 74 76 75  Temp:  98.9 F (37.2 C) 98.6 F (37 C) 98.3 F (36.8 C)  TempSrc:  Oral Oral Oral  Resp:  20 18 18   Height:  5' (1.524 m)    Weight:  51.7 kg (113 lb 15.7 oz)    SpO2: 98% 94% 95% 96%    Weight change:  Body mass index is 22.26 kg/(m^2).   Gen Exam: Awake and alert with clear speech.   Neck: Supple, No JVD.  Chest: B/L Clear.  CVS: S1 S2 Regular, no murmurs.  Abdomen: soft, BS +, non tender, non distended.  Extremities: no edema, lower extremities warm to touch. Neurologic: Non Focal.   Skin: No Rash.   Wounds: N/A.    Intake/Output from previous day:  Intake/Output Summary (Last 24 hours) at 04/04/12 1259 Last data filed at 04/04/12 0905  Gross per 24 hour  Intake 1417.5 ml  Output      1 ml  Net 1416.5 ml     LAB RESULTS: CBC  Lab 04/03/12 1522  WBC 7.9  HGB 12.3  HCT 35.1*  PLT 298  MCV 85.8  MCH 30.1  MCHC 35.0  RDW 14.0  LYMPHSABS 1.5  MONOABS 0.9  EOSABS 0.1  BASOSABS 0.0  BANDABS --    Chemistries   Lab 04/04/12 0450 04/03/12 1522  NA 133*  127*  K 4.2 3.6  CL 103 95*  CO2 17* 20  GLUCOSE 89 89  BUN 16 19  CREATININE 0.62 0.73  CALCIUM 9.0 9.2  MG -- --    CBG:  Lab 04/04/12 1133  GLUCAP 104*    GFR Estimated Creatinine Clearance: 30.2 ml/min (by C-G formula based on Cr of 0.62).  Coagulation profile No results found for this basename: INR:5,PROTIME:5 in the last 168 hours  Cardiac Enzymes No results found for this basename: CK:3,CKMB:3,TROPONINI:3,MYOGLOBIN:3 in the last 168 hours  No components found with this basename: POCBNP:3 No results found for this basename: DDIMER:2 in the last 72 hours No results found for this basename: HGBA1C:2 in the last 72 hours No results found for this basename: CHOL:2,HDL:2,LDLCALC:2,TRIG:2,CHOLHDL:2,LDLDIRECT:2 in the last 72 hours No results found for this basename: TSH,T4TOTAL,FREET3,T3FREE,THYROIDAB in the last 72 hours No results found for this basename: VITAMINB12:2,FOLATE:2,FERRITIN:2,TIBC:2,IRON:2,RETICCTPCT:2 in the last 72 hours No results found for this basename: LIPASE:2,AMYLASE:2 in the last 72 hours  Urine Studies No results found for this basename: UACOL:2,UAPR:2,USPG:2,UPH:2,UTP:2,UGL:2,UKET:2,UBIL:2,UHGB:2,UNIT:2,UROB:2,ULEU:2,UEPI:2,UWBC:2,URBC:2,UBAC:2,CAST:2,CRYS:2,UCOM:2,BILUA:2 in the last 72 hours  MICROBIOLOGY: No results found for this or any previous visit (from the past 240 hour(s)).  RADIOLOGY STUDIES/RESULTS: Dg Chest 2 View  03/14/2012  *RADIOLOGY REPORT*  Clinical Data:  Cough, congestion  CHEST - 2 VIEW  Comparison: 05/13/2009  Findings: Hyperaeration compatible with emphysema again noted. Heart size is normal. Aorta is ectatic and unfolded.  Bibasilar scarring is stable.  No new pulmonary consolidation.  No pleural effusion.  Left posterior hemidiaphragmatic eventration again incidentally noted.  IMPRESSION: No acute cardiopulmonary process.  Stable emphysematous change.   Original Report Authenticated By: Harrel Lemon, M.D.    Dg Abd 1  View  04/03/2012  *RADIOLOGY REPORT*  Clinical Data: Abdominal pain and constipation.  ABDOMEN - 1 VIEW  Comparison: 11/24/2010.  Findings: There is a large amount of stool throughout the colon and in the rectum suggesting fecal impaction and constipation.  There are air filled loops of small bowel without significant distention. No free air.  The bony structures are stable.  Remote pelvic fractures and lumbar compression fractures are noted.  IMPRESSION: Large amount of stool throughout the colon and in the rectum suggesting fecal impaction and constipation.   Original Report Authenticated By: P. Loralie Champagne, M.D.     MEDICATIONS: Scheduled Meds:   . aspirin EC  81 mg Oral Daily  . brimonidine  1 drop Both Eyes Q12H  . enoxaparin (LOVENOX) injection  40 mg Subcutaneous Q24H  . influenza  inactive virus vaccine  0.5 mL Intramuscular Tomorrow-1000  . multivitamin with minerals  1 tablet Oral Daily  . polyethylene glycol  68 g Oral Once  . polyethylene glycol-electrolytes  4,000 mL Oral Once  . senna  2 tablet Oral BID  . sodium phosphate  1 enema Rectal Once  . timolol  1 drop Both Eyes Q12H  . Travoprost (BAK Free)  1 drop Both Eyes QHS  . DISCONTD: aspirin  81 mg Oral Daily  . DISCONTD: brimonidine-timolol  1 drop Both Eyes Q12H  . DISCONTD: docusate sodium  100 mg Oral BID  . DISCONTD: multivitamin with minerals  1 tablet Oral Daily  . DISCONTD: polyethylene glycol  17 g Oral TID  . DISCONTD: senna  2 tablet Oral QHS   Continuous Infusions:   . sodium chloride 75 mL/hr at 04/04/12 0858   PRN Meds:.acetaminophen, acetaminophen  Antibiotics: Anti-infectives    None       Jeoffrey Massed, MD  [facility] Pager:[phone]  If 7PM-7AM, please contact night-coverage [email or web address] Password [email or web address] 04/04/2012, 12:59 PM   LOS: 1 day

## 2012-04-04 NOTE — Progress Notes (Signed)
04/04/12 Patient has had 11 BMs through the day. Last few good size becoming liquid.

## 2012-04-05 MED ORDER — SENNA 8.6 MG PO TABS: 2.0000 | ORAL_TABLET | Freq: Every day | ORAL | Status: DC | PRN

## 2012-04-05 NOTE — ED Provider Notes (Signed)
History     CSN: [id number]  Arrival date & time 04/03/12  1059   First MD Initiated Contact with Patient 04/03/12 1548      Chief Complaint  Patient presents with  . Rectal Pain     HPI Per pt having pressure in rectum and feel like she has to BM. Last BM yesterday and normal  Past Medical History  Diagnosis Date  . Hyperlipidemia   . Hypertension   . Osteopenia   . Constipation   . Anxiety     Past Surgical History  Procedure Date  . Abdominal hysterectomy   . Hernia repair   . Vag burch suspension     History reviewed. No pertinent family history.  History  Substance Use Topics  . Smoking status: Never Smoker   . Smokeless tobacco: Former Neurosurgeon    Quit date: 10/28/2003  . Alcohol Use: No    OB History    Grav Para Term Preterm Abortions TAB SAB Ect Mult Living                  Review of Systems  All other systems reviewed and are negative.    Allergies  Codeine  Home Medications  No current outpatient prescriptions on file.  BP 136/83  Pulse 65  Temp 98.5 F (36.9 C) (Oral)  Resp 16  Ht 5' (1.524 m)  Wt 113 lb 15.7 oz (51.7 kg)  BMI 22.26 kg/m2  SpO2 95%  Physical Exam  Nursing note and vitals reviewed. Constitutional: She is oriented to person, place, and time. She appears well-developed. No distress.  HENT:  Head: Normocephalic and atraumatic.  Eyes: Pupils are equal, round, and reactive to light.  Neck: Normal range of motion.  Cardiovascular: Normal rate and intact distal pulses.   Pulmonary/Chest: No respiratory distress.  Abdominal: Normal appearance. She exhibits no distension.  Genitourinary: Guaiac negative stool (Large amount of soft stool in rectal vault).  Musculoskeletal: Normal range of motion.  Neurological: She is alert and oriented to person, place, and time. No cranial nerve deficit.  Skin: Skin is warm and dry. No rash noted.  Psychiatric: She has a normal mood and affect. Her behavior is normal.    ED Course    Procedures (including critical care time)  Labs Reviewed  CBC WITH DIFFERENTIAL - Abnormal; Notable for the following:    HCT 35.1 (*)     All other components within normal limits  COMPREHENSIVE METABOLIC PANEL - Abnormal; Notable for the following:    Sodium 127 (*)     Chloride 95 (*)     GFR calc non Af Amer 70 (*)     GFR calc Af Amer 82 (*)     All other components within normal limits  BASIC METABOLIC PANEL - Abnormal; Notable for the following:    Sodium 133 (*)     CO2 17 (*)     GFR calc non Af Amer 74 (*)     GFR calc Af Amer 86 (*)     All other components within normal limits  GLUCOSE, CAPILLARY - Abnormal; Notable for the following:    Glucose-Capillary 104 (*)     All other components within normal limits  OCCULT BLOOD, POC DEVICE  OCCULT BLOOD X 1 CARD TO LAB, STOOL   Dg Abd 1 View  04/03/2012  *RADIOLOGY REPORT*  Clinical Data: Abdominal pain and constipation.  ABDOMEN - 1 VIEW  Comparison: 11/24/2010.  Findings: There is a large amount  of stool throughout the colon and in the rectum suggesting fecal impaction and constipation.  There are air filled loops of small bowel without significant distention. No free air.  The bony structures are stable.  Remote pelvic fractures and lumbar compression fractures are noted.  IMPRESSION: Large amount of stool throughout the colon and in the rectum suggesting fecal impaction and constipation.   Original Report Authenticated By: P. Loralie Champagne, M.D.      1. Rectal pain   2. Fecal impaction   3. Dehydration   4. Dementia   5. Constipation       MDM  Tried enema.  Unsuccessful.  Will admit for disimpaction        Nelia Shi, MD 04/05/12 [phone]

## 2012-04-05 NOTE — Discharge Summary (Signed)
Physician Discharge Summary  Dominique Frank ZOX:[id number] DOB: [birth year] DOA: 04/03/2012  PCP: Margaree Mackintosh, MD  Admit date: 04/03/2012 Discharge date: 04/05/2012  Recommendations for Outpatient Follow-up:  1. Patient was recently admitted due to impaction please be sure that the regimen she is on is adequate for her once seen in follow up. 2. Also please be sure to check BP  Discharge Diagnoses:  Principal Problem:  *Dehydration Active Problems:  Dementia  HTN (hypertension)  Constipation  Glaucoma  Hyponatremia  Fecal impaction   Discharge Condition: Stable  Diet recommendation: regular diet  Filed Weights   04/03/12 1959  Weight: 51.7 kg (113 lb 15.7 oz)    History of present illness:  From original HPI Dominique Frank is a 78 y.o. female who was brought to the emergency room by her family today because she has not had a bowel movement in 4 days and she was complaining of rectal pain. They also noticed that she was weaker and that she seemed dehydrated. In the emergency room the patient was found to have severe fecal impaction, hyponatremia and she was referred for observation in the hospital.  Hospital Course:  Dehydration  -resolved with IVF   Active Problems:  Hyponatremia  -2/2 to above  -Na better and 133 on d/c date  Fecal Impaction  -Reportedly patient has had 11 BM's on current regimen, Miralax and senna.  Will plan on discharging on this regimen and have patient follow up with her PCP for further adjustments or recommendations.  I have also recommended patient increase her fluid oral intake and continue to ambulate. -belly is soft -no SBO on Xray   Dementia  -at baseline   HTN (hypertension)  Well controlled off of medication today will have patient follow up with primary care physician for further evaluation and recommendations   Glaucoma  -c/w home meds  Procedures: None  Consultations:  None  Discharge Exam: Filed Vitals:   04/04/12 0438  04/04/12 1500 04/04/12 2055 04/05/12 0600  BP: 174/82 111/68 136/83 116/80  Pulse: 75 62 65 63  Temp: 98.3 F (36.8 C) 99.1 F (37.3 C) 98.5 F (36.9 C) 98.5 F (36.9 C)  TempSrc: Oral Oral Oral Oral  Resp: 18 18 16 16   Height:      Weight:      SpO2: 96% 95% 95% 95%    General: Pt in NAD, A and O x 3 Cardiovascular: RRR, No MRG Respiratory: CTA BL, no wheezes Abdomen: Soft, NT, ND  Discharge Instructions  Discharge Orders    Future Orders Please Complete By Expires   Diet - low sodium heart healthy      Increase activity slowly      Discharge instructions      Comments:   Please be sure to follow up with your primary care physician in 1-2 weeks or sooner should you have any new concerns.   Call MD for:  temperature >100.4      Call MD for:  persistant nausea and vomiting      Call MD for:  severe uncontrolled pain          Medication List     As of 04/05/2012 11:07 AM    TAKE these medications         aspirin 81 MG EC tablet   Take 81 mg by mouth daily.      calcium carbonate 1250 MG chewable tablet   Commonly known as: OS-CAL   Chew 1 tablet by mouth  daily.      COMBIGAN 0.2-0.5 % ophthalmic solution   Generic drug: brimonidine-timolol   Place 1 drop into both eyes.      multivitamin with minerals tablet   Take 1 tablet by mouth daily.      polyethylene glycol packet   Commonly known as: MIRALAX / GLYCOLAX   Take 17 g by mouth daily. Every other day      senna 8.6 MG Tabs   Commonly known as: SENOKOT   Take 2 tablets (17.2 mg total) by mouth daily as needed (constipation).      TRAVATAN Z 0.004 % Soln ophthalmic solution   Generic drug: Travoprost (BAK Free)   Place 1 drop into both eyes at bedtime.          The results of significant diagnostics from this hospitalization (including imaging, microbiology, ancillary and laboratory) are listed below for reference.    Significant Diagnostic Studies: Dg Chest 2 View  03/14/2012  *RADIOLOGY  REPORT*  Clinical Data: Cough, congestion  CHEST - 2 VIEW  Comparison: 05/13/2009  Findings: Hyperaeration compatible with emphysema again noted. Heart size is normal. Aorta is ectatic and unfolded.  Bibasilar scarring is stable.  No new pulmonary consolidation.  No pleural effusion.  Left posterior hemidiaphragmatic eventration again incidentally noted.  IMPRESSION: No acute cardiopulmonary process.  Stable emphysematous change.   Original Report Authenticated By: Harrel Lemon, M.D.    Dg Abd 1 View  04/03/2012  *RADIOLOGY REPORT*  Clinical Data: Abdominal pain and constipation.  ABDOMEN - 1 VIEW  Comparison: 11/24/2010.  Findings: There is a large amount of stool throughout the colon and in the rectum suggesting fecal impaction and constipation.  There are air filled loops of small bowel without significant distention. No free air.  The bony structures are stable.  Remote pelvic fractures and lumbar compression fractures are noted.  IMPRESSION: Large amount of stool throughout the colon and in the rectum suggesting fecal impaction and constipation.   Original Report Authenticated By: P. Loralie Champagne, M.D.     Microbiology: No results found for this or any previous visit (from the past 240 hour(s)).   Labs: Basic Metabolic Panel:  Lab 04/04/12 1610 04/03/12 1522  NA 133* 127*  K 4.2 3.6  CL 103 95*  CO2 17* 20  GLUCOSE 89 89  BUN 16 19  CREATININE 0.62 0.73  CALCIUM 9.0 9.2  MG -- --  PHOS -- --   Liver Function Tests:  Lab 04/03/12 1522  AST 17  ALT 9  ALKPHOS 72  BILITOT 0.4  PROT 6.5  ALBUMIN 3.5   No results found for this basename: LIPASE:5,AMYLASE:5 in the last 168 hours No results found for this basename: AMMONIA:5 in the last 168 hours CBC:  Lab 04/03/12 1522  WBC 7.9  NEUTROABS 5.5  HGB 12.3  HCT 35.1*  MCV 85.8  PLT 298   Cardiac Enzymes: No results found for this basename: CKTOTAL:5,CKMB:5,CKMBINDEX:5,TROPONINI:5 in the last 168 hours BNP: BNP (last  3 results) No results found for this basename: PROBNP:3 in the last 8760 hours CBG:  Lab 04/04/12 1133  GLUCAP 104*    Time coordinating discharge: > 35 minutes  Signed:  Penny Pia  [facility] 04/05/2012, 11:07 AM

## 2012-04-05 NOTE — Progress Notes (Signed)
Nsg Discharge Note  Admit Date:  04/03/2012 Discharge date: 04/05/2012   Maida Sale Mcmillion to be D/C'd Home per MD order.  AVS completed.  Copy for chart, and copy for patient signed, and dated. Patient/caregiver able to verbalize understanding.  Discharge Medication:  Azaleya, Strouss  Home Medication Instructions ZOX:[id number]   Printed on:04/05/12 1208  Medication Information                    aspirin 81 MG EC tablet Take 81 mg by mouth daily.             polyethylene glycol (MIRALAX / GLYCOLAX) packet Take 17 g by mouth daily. Every other day            Multiple Vitamins-Minerals (MULTIVITAMIN WITH MINERALS) tablet Take 1 tablet by mouth daily.             calcium carbonate (OS-CAL) 1250 MG chewable tablet Chew 1 tablet by mouth daily.             COMBIGAN 0.2-0.5 % ophthalmic solution Place 1 drop into both eyes.            TRAVATAN Z 0.004 % ophthalmic solution Place 1 drop into both eyes at bedtime.            senna (SENOKOT) 8.6 MG TABS Take 2 tablets (17.2 mg total) by mouth daily as needed (constipation).             Discharge Assessment: Filed Vitals:   04/05/12 0600  BP: 116/80  Pulse: 63  Temp: 98.5 F (36.9 C)  Resp: 16   Skin clean, dry and intact without evidence of skin break down, no evidence of skin tears noted. IV catheter discontinued intact. Site without signs and symptoms of complications - no redness or edema noted at insertion site, patient denies c/o pain - only slight tenderness at site.  Dressing with slight pressure applied.  D/c Instructions-Education: Discharge instructions given to patient/family with verbalized understanding. D/c education completed with patient/family including follow up instructions, medication list, d/c activities limitations if indicated, with other d/c instructions as indicated by MD - patient able to verbalize understanding, all questions fully answered. Patient instructed to return to ED, call 911, or call MD for any  changes in condition.  Patient escorted via WC, and D/C home via private auto.  Tamura Lasky Consuella Lose, RN 04/05/2012 12:08 PM

## 2012-04-05 NOTE — Care Management Note (Signed)
    Page 1 of 1   04/05/2012     11:54:31 AM   CARE MANAGEMENT NOTE 04/05/2012  Patient:  Dominique Frank, Dominique Frank   Account Number:  [id number]  Date Initiated:  04/05/2012  Documentation initiated by:  Letha Cape  Subjective/Objective Assessment:   dx dehydration  admit- lives alone, has 24 hr aide.     Action/Plan:   Anticipated DC Date:  04/05/2012   Anticipated DC Plan:  HOME W HOME HEALTH SERVICES      DC Planning Services  CM consult      [facility] Choice  HOME HEALTH   Choice offered to / List presented to:  C-4 Adult Children        HH arranged  HH-1 RN  HH-6 SOCIAL WORKER      HH agency  [facility]   Status of service:  Completed, signed off Medicare Important Message given?   (If response is "NO", the following Medicare IM given date fields will be blank) Date Medicare IM given:   Date Additional Medicare IM given:    Discharge Disposition:  HOME W HOME HEALTH SERVICES  Per UR Regulation:  Reviewed for med. necessity/level of care/duration of stay  If discussed at Long Length of Stay Meetings, dates discussed:    Comments:  04/05/12 11:51 Letha Cape RN, BSN [id number] pt lives alone, but has a 24 /hr aide.  Son states they would like to have [facility] for medication management and a CSW for community resources.  He chose [facility] from the agency list.  Referral made to [facility], Marie notified.  Soc will begin 24-48 hrs post discharge.  Patient for dc todlay.

## 2012-04-20 ENCOUNTER — Ambulatory Visit (INDEPENDENT_AMBULATORY_CARE_PROVIDER_SITE_OTHER): Payer: Medicare Other | Admitting: Internal Medicine

## 2012-04-20 ENCOUNTER — Encounter: Payer: Self-pay | Admitting: Internal Medicine

## 2012-04-20 VITALS — BP 154/84 | HR 84 | Temp 98.6°F | Wt 109.5 lb

## 2012-04-20 DIAGNOSIS — R630 Anorexia: Secondary | ICD-10-CM

## 2012-04-20 DIAGNOSIS — F0390 Unspecified dementia without behavioral disturbance: Secondary | ICD-10-CM

## 2012-04-20 DIAGNOSIS — K5900 Constipation, unspecified: Secondary | ICD-10-CM

## 2012-04-20 DIAGNOSIS — E860 Dehydration: Secondary | ICD-10-CM

## 2012-04-20 LAB — BASIC METABOLIC PANEL
Calcium: 9.9 mg/dL (ref 8.4–10.5)
Glucose, Bld: 84 mg/dL (ref 70–99)
Potassium: 4.7 mEq/L (ref 3.5–5.3)
Sodium: 138 mEq/L (ref 135–145)

## 2012-04-20 MED ORDER — MEGESTROL ACETATE 40 MG/ML PO SUSP: 200.0000 mg | Freq: Two times a day (BID) | ORAL | Status: DC

## 2012-04-20 NOTE — Progress Notes (Signed)
  Subjective:    Patient ID: Dominique Frank, female    DOB: 08-10-1913, 78 y.o.   MRN: [id number]  HPI Recently hospitalized  for fecal impaction and volume depletion. Was started on Megace for appetite stimulation. However, some caretakers have decided she does not need that. This is disconcerting. Had long talk today about discontinuing medication without letting the doctor know. There is some confusion about whether she should be on daily MiraLAX and/or stool softeners. There is also concerned about some perirectal redness. Recently was placed on daily Senokot and daily MiraLAX in the hospital. Currently having daily bowel movements according to caretakers.    Review of Systems     Objective:   Physical Exam she is alert. She knows me. She looks nice and is well-dressed. She knows the day of the week. Chest is clear.dictation. Cardiac exam regular rate and rhythm normal S1 and S2. Extremities without edema. Rectal exam shows some mild. Anal redness without skin breakdown. She has no fecal impaction. Loose stool in rectal vault.        Assessment & Plan:  Mild dementia  History of volume depletion  History of fecal impaction  Decreased appetite  Plan: Restart Megace as previously prescribed. She may not need daily Senokot but likely needs daily MiraLAX. Prescribed Lotrisone cream to use in rectal area twice daily. Return in 4-6 weeks for weight check on Megace.

## 2012-04-23 DIAGNOSIS — R630 Anorexia: Secondary | ICD-10-CM | POA: Insufficient documentation

## 2012-04-23 NOTE — Patient Instructions (Addendum)
Restart Megace as previously directed. Continue daily MiraLAX. May not need daily Senokot but certainly can use it when necessary every 3 days if no bowel movement. Return in 4 weeks for weight check and office visit.

## 2012-05-25 ENCOUNTER — Other Ambulatory Visit (INDEPENDENT_AMBULATORY_CARE_PROVIDER_SITE_OTHER): Payer: Medicare Other | Admitting: Internal Medicine

## 2012-05-25 DIAGNOSIS — R634 Abnormal weight loss: Secondary | ICD-10-CM

## 2012-05-25 NOTE — Patient Instructions (Addendum)
Resume Megace for appetite stimulation. Return in 3 months.

## 2012-05-25 NOTE — Progress Notes (Signed)
  Subjective:    Patient ID: Dominique Frank, female    DOB: Nov 16, 1913, 78 y.o.   MRN: [id number]  HPI 78 year old white female with dementia and weight loss in today for weight check. She's been on Megace however family recently stopped it thinking that it was causing her to be confused. I do not know that Megace causes confusion. I think she is confused because of her age related dementia.  Patient not seen by physician today. Fairly here for weight check. Indeed weight has improved on Megace. Blood pressure stable.    Review of Systems     Objective:   Physical Exam        Assessment & Plan:  Nurse visit for weight check. Family will be advised to resume Megace for appetite stimulation. Return in 3 months.

## 2012-06-19 ENCOUNTER — Telehealth: Payer: Self-pay | Admitting: Internal Medicine

## 2012-06-19 NOTE — Telephone Encounter (Signed)
Pt is 78 years old and showing signs of dementia with intermittent confusion and agitation. Daughter in law, Steward Drone, wants her to try Aricept. Start with 5 mg daily for 2 weeks and increase to 10 mg daily. May cause nausea. If this is not tolerated, could try Namenda or just some mild anxiolytic.

## 2012-07-04 ENCOUNTER — Emergency Department (HOSPITAL_COMMUNITY)
Admission: EM | Admit: 2012-07-04 | Discharge: 2012-07-04 | Disposition: A | Discharge status: Home / Self Care | Payer: Medicare Other | Attending: Emergency Medicine | Admitting: Emergency Medicine

## 2012-07-04 ENCOUNTER — Encounter (HOSPITAL_COMMUNITY): Payer: Self-pay | Admitting: Neurology

## 2012-07-04 ENCOUNTER — Emergency Department (HOSPITAL_COMMUNITY): Payer: Medicare Other

## 2012-07-04 DIAGNOSIS — R1110 Vomiting, unspecified: Secondary | ICD-10-CM | POA: Insufficient documentation

## 2012-07-04 DIAGNOSIS — F411 Generalized anxiety disorder: Secondary | ICD-10-CM | POA: Insufficient documentation

## 2012-07-04 DIAGNOSIS — Z7982 Long term (current) use of aspirin: Secondary | ICD-10-CM | POA: Insufficient documentation

## 2012-07-04 DIAGNOSIS — R55 Syncope and collapse: Secondary | ICD-10-CM | POA: Insufficient documentation

## 2012-07-04 DIAGNOSIS — Z79899 Other long term (current) drug therapy: Secondary | ICD-10-CM | POA: Insufficient documentation

## 2012-07-04 DIAGNOSIS — M949 Disorder of cartilage, unspecified: Secondary | ICD-10-CM | POA: Insufficient documentation

## 2012-07-04 DIAGNOSIS — K5900 Constipation, unspecified: Secondary | ICD-10-CM | POA: Insufficient documentation

## 2012-07-04 DIAGNOSIS — I10 Essential (primary) hypertension: Secondary | ICD-10-CM | POA: Insufficient documentation

## 2012-07-04 DIAGNOSIS — E785 Hyperlipidemia, unspecified: Secondary | ICD-10-CM | POA: Insufficient documentation

## 2012-07-04 DIAGNOSIS — M899 Disorder of bone, unspecified: Secondary | ICD-10-CM | POA: Insufficient documentation

## 2012-07-04 LAB — CBC WITH DIFFERENTIAL/PLATELET
Basophils Absolute: 0 10*3/uL (ref 0.0–0.1)
Eosinophils Relative: 0 % (ref 0–5)
HCT: 40.2 % (ref 36.0–46.0)
Lymphocytes Relative: 4 % — ABNORMAL LOW (ref 12–46)
Lymphs Abs: 0.4 10*3/uL — ABNORMAL LOW (ref 0.7–4.0)
MCV: 89.5 fL (ref 78.0–100.0)
Monocytes Absolute: 0.3 10*3/uL (ref 0.1–1.0)
Neutro Abs: 9.2 10*3/uL — ABNORMAL HIGH (ref 1.7–7.7)
Platelets: 278 10*3/uL (ref 150–400)
RBC: 4.49 MIL/uL (ref 3.87–5.11)
RDW: 13.8 % (ref 11.5–15.5)
WBC: 9.9 10*3/uL (ref 4.0–10.5)

## 2012-07-04 LAB — BASIC METABOLIC PANEL
CO2: 25 mEq/L (ref 19–32)
Chloride: 99 mEq/L (ref 96–112)
Glucose, Bld: 138 mg/dL — ABNORMAL HIGH (ref 70–99)
Sodium: 135 mEq/L (ref 135–145)

## 2012-07-04 MED ORDER — SODIUM CHLORIDE 0.9 % IV BOLUS (SEPSIS)
250.0000 mL | Freq: Once | INTRAVENOUS | Status: AC
  Administered 2012-07-04: 250 mL via INTRAVENOUS

## 2012-07-04 MED ORDER — SODIUM CHLORIDE 0.9 % IV SOLN: INTRAVENOUS | Status: DC

## 2012-07-04 MED ORDER — ONDANSETRON HCL 4 MG/2ML IJ SOLN
4.0000 mg | Freq: Once | INTRAMUSCULAR | Status: AC
  Administered 2012-07-04: 4 mg via INTRAVENOUS
  Filled 2012-07-04: qty 2

## 2012-07-04 MED ORDER — ONDANSETRON 4 MG PO TBDP: 4.0000 mg | ORAL_TABLET | Freq: Three times a day (TID) | ORAL | Status: DC | PRN

## 2012-07-04 NOTE — ED Provider Notes (Signed)
History     CSN: [id number]  Arrival date & time 07/04/12  4098   First MD Initiated Contact with Patient 07/04/12 [phone]      Chief Complaint  Patient presents with  . Leg Pain    (Consider location/radiation/quality/duration/timing/severity/associated sxs/prior treatment) The history is provided by the EMS personnel and the patient.   9 your old female brought in by EMS. Has home health nurse. Patient is morning was complaining of a significant leg cramps then had an episode of vomiting and near syncopal episode. Patient when EMS got there was completely asymptomatic patient feels fine now. Denies chest pain shortness of breath leg pain abdominal pain headache or fever. Vital signs in route were normal.  Past Medical History  Diagnosis Date  . Hyperlipidemia   . Hypertension   . Osteopenia   . Constipation   . Anxiety     Past Surgical History  Procedure Date  . Abdominal hysterectomy   . Hernia repair   . Vag burch suspension     No family history on file.  History  Substance Use Topics  . Smoking status: Never Smoker   . Smokeless tobacco: Former Neurosurgeon    Quit date: 10/28/2003  . Alcohol Use: No    OB History    Grav Para Term Preterm Abortions TAB SAB Ect Mult Living                  Review of Systems  Constitutional: Negative for fever.  HENT: Negative for congestion.   Eyes: Negative for redness and visual disturbance.  Respiratory: Negative for shortness of breath.   Cardiovascular: Negative for chest pain and leg swelling.  Gastrointestinal: Positive for vomiting. Negative for abdominal pain and diarrhea.  Genitourinary: Negative for dysuria.  Musculoskeletal: Negative for back pain.  Skin: Negative for rash.  Neurological: Negative for speech difficulty and headaches.    Allergies  Codeine  Home Medications   Current Outpatient Rx  Name  Route  Sig  Dispense  Refill  . ASPIRIN 81 MG PO TBEC   Oral   Take 81 mg by mouth daily.             Marland Kitchen CALCIUM CARBONATE 1250 MG PO CHEW   Oral   Chew 1 tablet by mouth daily.           . COMBIGAN 0.2-0.5 % OP SOLN   Both Eyes   Place 1 drop into both eyes.          . DOCUSATE SODIUM 50 MG PO CAPS   Oral   Take 100 mg by mouth 2 (two) times daily.         . DONEPEZIL HCL 10 MG PO TABS   Oral   Take 5 mg by mouth at bedtime.          . OMEPRAZOLE 20 MG PO CPDR   Oral   Take 20 mg by mouth daily.         Marland Kitchen POLYETHYLENE GLYCOL 3350 PO PACK   Oral   Take 17 g by mouth daily as needed. For constipation         . TRAVATAN Z 0.004 % OP SOLN   Both Eyes   Place 1 drop into both eyes at bedtime.          Marland Kitchen ONDANSETRON 4 MG PO TBDP   Oral   Take 1 tablet (4 mg total) by mouth every 8 (eight) hours as needed for nausea.  12 tablet   0     BP 101/48  Pulse 87  Temp 97.2 F (36.2 C)  Resp 16  SpO2 98%  Physical Exam  Nursing note and vitals reviewed. Constitutional: She appears well-developed and well-nourished. No distress.  HENT:  Head: Normocephalic and atraumatic.  Mouth/Throat: Oropharynx is clear and moist.  Eyes: Conjunctivae normal and EOM are normal. Pupils are equal, round, and reactive to light.  Neck: Normal range of motion. Neck supple.  Cardiovascular: Normal rate, regular rhythm and normal heart sounds.   Pulmonary/Chest: Effort normal and breath sounds normal.  Abdominal: Soft. Bowel sounds are normal. There is no tenderness.  Musculoskeletal: Normal range of motion. She exhibits no edema and no tenderness.  Neurological: She is alert. No cranial nerve deficit. She exhibits normal muscle tone. Coordination normal.  Skin: Skin is warm. No rash noted.    ED Course  Procedures (including critical care time)  Labs Reviewed  CBC WITH DIFFERENTIAL - Abnormal; Notable for the following:    Neutrophils Relative 92 (*)     Neutro Abs 9.2 (*)     Lymphocytes Relative 4 (*)     Lymphs Abs 0.4 (*)     All other components within normal  limits  BASIC METABOLIC PANEL - Abnormal; Notable for the following:    Glucose, Bld 138 (*)     GFR calc non Af Amer 75 (*)     GFR calc Af Amer 87 (*)     All other components within normal limits   Dg Chest 2 View  07/04/2012  *RADIOLOGY REPORT*  Clinical Data: Plate pain and weakness.  CHEST - 2 VIEW  Comparison: 03/14/2012.  Findings: The cardiac silhouette, mediastinal and hilar contours are within normal limits and stable.  There is tortuosity and calcification of the thoracic aorta.  The lungs are clear of acute process.  Artifact from EKG leads is noted.  Minimal right upper lobe and bibasilar scarring changes.  IMPRESSION: No acute cardiopulmonary findings.  Minimal scarring changes.   Original Report Authenticated By: Rudie Meyer, M.D.     Date: 07/04/2012  Rate: 76  Rhythm: normal sinus rhythm  QRS Axis: normal  Intervals: normal  ST/T Wave abnormalities: nonspecific ST/T changes  Conduction Disutrbances:none  Narrative Interpretation:   Old EKG Reviewed: none available  Results for orders placed during the hospital encounter of 07/04/12  CBC WITH DIFFERENTIAL      Component Value Range   WBC 9.9  4.0 - 10.5 K/uL   RBC 4.49  3.87 - 5.11 MIL/uL   Hemoglobin 13.7  12.0 - 15.0 g/dL   HCT 16.1  09.6 - 04.5 %   MCV 89.5  78.0 - 100.0 fL   MCH 30.5  26.0 - 34.0 pg   MCHC 34.1  30.0 - 36.0 g/dL   RDW 40.9  81.1 - 91.4 %   Platelets 278  150 - 400 K/uL   Neutrophils Relative 92 (*) 43 - 77 %   Neutro Abs 9.2 (*) 1.7 - 7.7 K/uL   Lymphocytes Relative 4 (*) 12 - 46 %   Lymphs Abs 0.4 (*) 0.7 - 4.0 K/uL   Monocytes Relative 3  3 - 12 %   Monocytes Absolute 0.3  0.1 - 1.0 K/uL   Eosinophils Relative 0  0 - 5 %   Eosinophils Absolute 0.0  0.0 - 0.7 K/uL   Basophils Relative 0  0 - 1 %   Basophils Absolute 0.0  0.0 - 0.1  K/uL  BASIC METABOLIC PANEL      Component Value Range   Sodium 135  135 - 145 mEq/L   Potassium 4.5  3.5 - 5.1 mEq/L   Chloride 99  96 - 112 mEq/L    CO2 25  19 - 32 mEq/L   Glucose, Bld 138 (*) 70 - 99 mg/dL   BUN 20  6 - 23 mg/dL   Creatinine, Ser 4.09  0.50 - 1.10 mg/dL   Calcium 9.6  8.4 - 81.1 mg/dL   GFR calc non Af Amer 75 (*) >90 mL/min   GFR calc Af Amer 87 (*) >90 mL/min     1. Near syncope   2. Vomiting       MDM  Patient completely fine in the emergency department. No symptoms. Vital signs very normal. Patient had the significant leg cramps at home then vomited and then had a near syncopal event. Patient was improving at the time EMS had arrived no CPR was initiated. Patient without any specific complaints currently. Suspect it may have been all related to the pain which would cause the nausea episode which then caused a vasovagal episode. Patient's electrolytes here EKG cardiac monitoring time examination without any significant abnormalities. Patient was given Zofran in the emergency part will continue that at home.        Shelda Jakes, MD 07/04/12 [phone]

## 2012-07-04 NOTE — ED Notes (Addendum)
Per ems- Pt lives at home with caregiver and family close by  c/o chronic leg pain this morning with sudden leg pain this morning that made her vomit x 1. Denies any pain at this time or other complaint. Pt is alert and oriented, moving all extremities.

## 2012-07-04 NOTE — ED Notes (Signed)
Pt taken out to car in wheelchair.  

## 2012-07-04 NOTE — ED Notes (Signed)
Patient transported to X-ray 

## 2012-09-04 ENCOUNTER — Encounter: Payer: Self-pay | Admitting: Internal Medicine

## 2012-09-04 ENCOUNTER — Ambulatory Visit (INDEPENDENT_AMBULATORY_CARE_PROVIDER_SITE_OTHER): Payer: Medicare Other | Admitting: Internal Medicine

## 2012-09-04 VITALS — BP 148/80 | HR 80 | Temp 97.0°F | Ht <= 58 in | Wt 113.0 lb

## 2012-09-04 DIAGNOSIS — I10 Essential (primary) hypertension: Secondary | ICD-10-CM

## 2012-09-04 LAB — COMPREHENSIVE METABOLIC PANEL
ALT: 8 U/L (ref 0–35)
BUN: 12 mg/dL (ref 6–23)
CO2: 25 mEq/L (ref 19–32)
Calcium: 9.7 mg/dL (ref 8.4–10.5)
Chloride: 99 mEq/L (ref 96–112)
Creat: 0.67 mg/dL (ref 0.50–1.10)
Glucose, Bld: 90 mg/dL (ref 70–99)

## 2012-09-04 LAB — CBC WITH DIFFERENTIAL/PLATELET
Eosinophils Absolute: 0.1 10*3/uL (ref 0.0–0.7)
Eosinophils Relative: 1 % (ref 0–5)
HCT: 38.2 % (ref 36.0–46.0)
Hemoglobin: 12.9 g/dL (ref 12.0–15.0)
Lymphocytes Relative: 24 % (ref 12–46)
Lymphs Abs: 1.2 10*3/uL (ref 0.7–4.0)
MCH: 29.7 pg (ref 26.0–34.0)
MCV: 88 fL (ref 78.0–100.0)
Monocytes Absolute: 0.5 10*3/uL (ref 0.1–1.0)
Monocytes Relative: 9 % (ref 3–12)
RBC: 4.34 MIL/uL (ref 3.87–5.11)
WBC: 5.1 10*3/uL (ref 4.0–10.5)

## 2012-09-04 LAB — POCT URINALYSIS DIPSTICK
Bilirubin, UA: NEGATIVE
Blood, UA: NEGATIVE
Glucose, UA: NEGATIVE
Ketones, UA: NEGATIVE
Leukocytes, UA: NEGATIVE
Nitrite, UA: NEGATIVE

## 2012-09-04 LAB — LIPID PANEL
Cholesterol: 189 mg/dL (ref 0–200)
Total CHOL/HDL Ratio: 3 Ratio
Triglycerides: 58 mg/dL (ref <150)

## 2012-09-04 NOTE — Progress Notes (Signed)
  Subjective:    Patient ID: Dominique Frank, female    DOB: [birth year], 79 y.o.   MRN: [id number]  HPI 79 year old White female with hx mild hypertension and dementia in for 6 month recheck. Tried on Megace for appetite stimulant but currently not taking it. Drinks one Ensure daily. Accompanied by her son, Dominique Frank who says she is doing well. Feels appetite is good and she has some mild confusion. Knows day of week but not year nor her age. Knows her birthdate.  Was hospitalized in September with fecal impaction and volume depletion. Have prescribed Aricept but she is currently not taking it. She has a history of GE reflux and takes Prilosec. She used to take HCTZ for mild hypertension but is no longer on that either. Has gained 3.5 pounds since last visit. Son says she's not having any issues with constipation. She lives at home and has a caretaker. Family is supportive.    Review of Systems     Objective:   Physical Exam Chest is clear to auscultation. Cardiac exam regular rate and rhythm normal S1 and S2. Extremities without edema. She is alert. Affect is cheerful. Skin is warm and dry. Mental status exam described above.       Assessment & Plan:  Dementia  History of hypertension  History of fecal impaction  History of volume depletion  History of GE reflux  Plan: Continue same medications and return in 6 months.

## 2012-09-04 NOTE — Patient Instructions (Addendum)
Continue same medications and return in 6 months 

## 2012-09-05 LAB — VITAMIN D 25 HYDROXY (VIT D DEFICIENCY, FRACTURES): Vit D, 25-Hydroxy: 31 ng/mL (ref 30–89)

## 2012-11-16 ENCOUNTER — Encounter: Payer: Self-pay | Admitting: Internal Medicine

## 2012-11-16 ENCOUNTER — Ambulatory Visit (INDEPENDENT_AMBULATORY_CARE_PROVIDER_SITE_OTHER): Payer: Medicare Other | Admitting: Internal Medicine

## 2012-11-16 VITALS — BP 160/78 | HR 80 | Temp 98.5°F | Wt 116.0 lb

## 2012-11-16 DIAGNOSIS — R4182 Altered mental status, unspecified: Secondary | ICD-10-CM

## 2012-11-16 DIAGNOSIS — F0391 Unspecified dementia with behavioral disturbance: Secondary | ICD-10-CM

## 2012-11-16 DIAGNOSIS — R609 Edema, unspecified: Secondary | ICD-10-CM

## 2012-11-16 DIAGNOSIS — M7989 Other specified soft tissue disorders: Secondary | ICD-10-CM

## 2012-11-16 LAB — COMPREHENSIVE METABOLIC PANEL
ALT: 9 U/L (ref 0–35)
AST: 19 U/L (ref 0–37)
Albumin: 3.9 g/dL (ref 3.5–5.2)
Calcium: 9.3 mg/dL (ref 8.4–10.5)
Chloride: 100 mEq/L (ref 96–112)
Potassium: 3.9 mEq/L (ref 3.5–5.3)
Total Protein: 6 g/dL (ref 6.0–8.3)

## 2012-11-16 LAB — CBC WITH DIFFERENTIAL/PLATELET
Hemoglobin: 11.9 g/dL — ABNORMAL LOW (ref 12.0–15.0)
Lymphs Abs: 1.5 10*3/uL (ref 0.7–4.0)
MCH: 29.1 pg (ref 26.0–34.0)
Monocytes Relative: 9 % (ref 3–12)
Neutro Abs: 2.3 10*3/uL (ref 1.7–7.7)
Neutrophils Relative %: 54 % (ref 43–77)
RBC: 4.09 MIL/uL (ref 3.87–5.11)

## 2012-11-16 LAB — TSH: TSH: 1.83 u[IU]/mL (ref 0.350–4.500)

## 2012-11-16 LAB — VITAMIN B12: Vitamin B-12: 375 pg/mL (ref 211–911)

## 2012-11-19 ENCOUNTER — Encounter: Payer: Self-pay | Admitting: Internal Medicine

## 2012-11-19 NOTE — Progress Notes (Signed)
  Subjective:    Patient ID: Dominique Frank, female    DOB: Dec 11, 1913, 79 y.o.   MRN: [id number]  HPI  79 year old White female with dementia brought in today by her son. In December we prescribed Aricept for her. Apparently she did not take it for very long. She was started on 5 mg with instructions to increase to 10 mg after 2 weeks. Her son was away visiting his daughter at that time and did not realize that have been prescribed for dementia. Caretaker thought it was causing her to be nauseated. It was discontinued. Family says that she packs every day to go home to [facility]. She has hallucinations that her dog has been killed on the road and she cries out about it. Does not realize that her current home is her home of many years. Sometimes she knows her memory is not right. Symptoms seem worse since her husband died a few years ago.  Also they're complaining that her ankles are swelling. She's been treated with a diuretic for many years for mild hypertension. Currently not taking diuretic. She has history of glaucoma and is on a couple of prescription ophthalmic drops for that.    Review of Systems     Objective:   Physical Exam She does not know the day of the week, month of the year or the name of the President of the [place]. She is pleasant and cooperative. Seems to recognize me. Chest is clear to auscultation without rales or wheezing. No carotid bruits. Cardiac exam regular rate and rhythm normal S1 and S2. Extremities show trace swelling of the ankles without pitting edema. She is ambulatory.        Assessment & Plan:  Dementia  Dependent edema-son was reassured that this is something is mild and that doesn't really need to be treated. It does not represent congestive heart failure and is related to being in dependent position much of the day.  Plan: Rather than restart Aricept, it seems that she needs something to calm her down so she doesn't do repetitive behavior and get  upset. I started her on Zyprexa 5 mg at bedtime. We could add Remeron if this does not control her symptoms. We could try Namenda but at this point I don't think it would be a very much benefit. Son is to call me in 7-10 days and let me know how things are going. Lab work was drawn including TSH and B12 level.  Addendum: TSH and B12 level within normal limits.

## 2012-11-19 NOTE — Patient Instructions (Addendum)
Start Zyprexa 5 mg at bedtime. Call with progress report in 7-10 days. Lab work drawn and pending

## 2012-11-20 ENCOUNTER — Telehealth: Payer: Self-pay | Admitting: Internal Medicine

## 2012-11-20 NOTE — Telephone Encounter (Signed)
Noted Zyprexa 5mg  hs working well.

## 2012-11-23 ENCOUNTER — Telehealth: Payer: Self-pay | Admitting: Internal Medicine

## 2012-11-23 NOTE — Telephone Encounter (Signed)
Stop Zyprexa. May need to take to ED if  Volume depleted.

## 2013-01-12 ENCOUNTER — Encounter: Payer: Self-pay | Admitting: Internal Medicine

## 2013-01-12 ENCOUNTER — Ambulatory Visit (INDEPENDENT_AMBULATORY_CARE_PROVIDER_SITE_OTHER): Payer: Medicare Other | Admitting: Internal Medicine

## 2013-01-12 VITALS — BP 160/82 | HR 78 | Temp 98.2°F | Wt 114.5 lb

## 2013-01-12 DIAGNOSIS — G309 Alzheimer's disease, unspecified: Secondary | ICD-10-CM

## 2013-01-12 DIAGNOSIS — R609 Edema, unspecified: Secondary | ICD-10-CM

## 2013-01-12 DIAGNOSIS — H6122 Impacted cerumen, left ear: Secondary | ICD-10-CM

## 2013-01-12 DIAGNOSIS — I10 Essential (primary) hypertension: Secondary | ICD-10-CM

## 2013-01-12 DIAGNOSIS — H6120 Impacted cerumen, unspecified ear: Secondary | ICD-10-CM

## 2013-01-12 LAB — CBC WITH DIFFERENTIAL/PLATELET
Basophils Absolute: 0 10*3/uL (ref 0.0–0.1)
Basophils Relative: 0 % (ref 0–1)
HCT: 35.5 % — ABNORMAL LOW (ref 36.0–46.0)
MCHC: 33.8 g/dL (ref 30.0–36.0)
Monocytes Absolute: 0.3 10*3/uL (ref 0.1–1.0)
Neutro Abs: 3.3 10*3/uL (ref 1.7–7.7)
Neutrophils Relative %: 67 % (ref 43–77)
RDW: 14.5 % (ref 11.5–15.5)

## 2013-01-12 LAB — COMPREHENSIVE METABOLIC PANEL
Albumin: 4 g/dL (ref 3.5–5.2)
Alkaline Phosphatase: 80 U/L (ref 39–117)
BUN: 16 mg/dL (ref 6–23)
CO2: 28 mEq/L (ref 19–32)
Glucose, Bld: 92 mg/dL (ref 70–99)
Potassium: 4.5 mEq/L (ref 3.5–5.3)
Sodium: 137 mEq/L (ref 135–145)
Total Protein: 6.3 g/dL (ref 6.0–8.3)

## 2013-01-12 NOTE — Progress Notes (Signed)
  Subjective:    Patient ID: Dominique Frank, female    DOB: June 04, 1914, 79 y.o.   MRN: [id number]  HPI  79 year old female accompanied today by caretaker. Caretaker thinks patient's feet are swelling a great deal. Patient has dementia. We tried her on Zyprexa but family said after a few days but it made her too drowsy. I suggested they cut the dose in two quarters or half and give at at bedtime. She spends a lot of per day and packing trunks and taking pictures off-the-wall. She's on her feet a fair female. With the hot weather her feet a been swelling more. Caretaker think she doesn't have a good by mouth intake. She does drink some Ensure. Also complaining of decreased hearing. Patient is wearing a support stocking right lower extremity.    Review of Systems     Objective:   Physical Exam She is pleasant and cooperative. Knows address and birth date. Large piece of impacted cerumen removed from left external ear canal by curette. Right TM is clear. Neck is supple without JVD thyromegaly or carotid bruits. Chest clear to auscultation. Cardiac exam regular rate and rhythm normal S1 and S2. No S3. Trace right lower extremity edema that is nonpitting. No significant edema of left lower extremity.        Assessment & Plan:  Impacted cerumen left ear  Dependent edema  Dementia  Plan: CBC and C-met drawn. May wear support stocking right lower extremity if it helps swelling. Advise keep feet elevated. Am reluctant to start diuretic because it may cause volume depletion tickly she's not having a lot of by mouth intake. Explained this to caretaker. May try one quarter Zyprexa tablet at bedtime for agitation with dementia

## 2013-01-12 NOTE — Patient Instructions (Addendum)
Keep feet elevated as much as possible. May try one quarter Zyprexa tablet at bedtime for agitation. Return as needed.

## 2013-01-22 ENCOUNTER — Telehealth: Payer: Self-pay | Admitting: Internal Medicine

## 2013-01-22 MED ORDER — OLANZAPINE 2.5 MG PO TABS: 2.5000 mg | ORAL_TABLET | Freq: Every day | ORAL | Status: DC

## 2013-01-22 NOTE — Telephone Encounter (Signed)
Needs med at night for agitation Rx Zyprexa 2.5 mg hs. Family says 5 mg was too strong for her.

## 2013-03-07 ENCOUNTER — Telehealth: Payer: Self-pay | Admitting: Internal Medicine

## 2013-03-07 NOTE — Telephone Encounter (Signed)
Would only take BP once a week. BP will vary with agitation at her age. Do not treat at this point. She is not used to a lot of meds and she could have significant side effects

## 2013-03-08 NOTE — Telephone Encounter (Signed)
Caretaker informed.

## 2013-07-09 ENCOUNTER — Ambulatory Visit (INDEPENDENT_AMBULATORY_CARE_PROVIDER_SITE_OTHER): Payer: Medicare Other | Admitting: Internal Medicine

## 2013-07-09 ENCOUNTER — Encounter: Payer: Self-pay | Admitting: Internal Medicine

## 2013-07-09 ENCOUNTER — Ambulatory Visit
Admission: RE | Admit: 2013-07-09 | Discharge: 2013-07-09 | Disposition: A | Discharge status: Home / Self Care | Payer: Medicare Other | Source: Ambulatory Visit | Attending: Internal Medicine | Admitting: Internal Medicine

## 2013-07-09 VITALS — BP 168/90 | HR 84 | Temp 98.8°F | Wt 110.0 lb

## 2013-07-09 DIAGNOSIS — H9193 Unspecified hearing loss, bilateral: Secondary | ICD-10-CM

## 2013-07-09 DIAGNOSIS — R109 Unspecified abdominal pain: Secondary | ICD-10-CM

## 2013-07-09 DIAGNOSIS — K5900 Constipation, unspecified: Secondary | ICD-10-CM

## 2013-07-09 DIAGNOSIS — R413 Other amnesia: Secondary | ICD-10-CM

## 2013-07-09 DIAGNOSIS — H9190 Unspecified hearing loss, unspecified ear: Secondary | ICD-10-CM | POA: Insufficient documentation

## 2013-07-09 MED ORDER — PAROXETINE HCL 10 MG PO TABS: 10.0000 mg | ORAL_TABLET | Freq: Every day | ORAL | Status: DC

## 2013-07-09 NOTE — Patient Instructions (Signed)
Take Paxil daily depression. Use fleets enema once and then restart MiraLAX daily. If no result from fleets enema take Dulcolax 2 tablets one time dose.

## 2013-07-09 NOTE — Progress Notes (Signed)
   Subjective:    Patient ID: Dominique Frank, female    DOB: 09-26-1913, 80 y.o.   MRN: [id number]  HPI  Patient in today accompanied by her son. Complaining of abdominal pain. KUB consistent with constipation. Caretakers apparently not aware she was constipated. This has happened before. Just got influenza immunization last week at local pharmacy. Son says she has crying spells every afternoon about 5:00. Even though folks coming to eat with her, she subsequently says no one has been there all day. Patient reports feeling sad. We tried Zyprexa and he said it made her very flat even though it was low dose. She really doesn't tolerate medications very well. Has never taken many medications. Also complaining of hearing loss. He think she may have wax in her ears.    Review of Systems     Objective:   Physical Exam Not oriented to year or day of week. She is alert. She is pleasant and cooperative. TMs are chronically scarred. No blockage with cerumen bilaterally. Abdomen is distended, soft. No hepatosplenomegaly or masses appreciated. KUB consistent with constipation        Assessment & Plan:  Constipation- try fleets enema x1 then if no relief Dulcolax tablets x2. Restart MiraLAX daily which she used to take.  Hearing loss-doubt she would wear hearing aids at her age. Best to speak up when speaking with her.  Memory loss/dementia  Possible depression-start Paxil 10 mg daily. Discontinue Zyprexa. 25 minutes spent with patient and her son as well as reviewing x-ray results. Discussing treatment plan, management of memory and hearing loss.

## 2013-07-30 ENCOUNTER — Ambulatory Visit
Admission: RE | Admit: 2013-07-30 | Discharge: 2013-07-30 | Disposition: A | Discharge status: Home / Self Care | Payer: Medicare Other | Source: Ambulatory Visit | Attending: Internal Medicine | Admitting: Internal Medicine

## 2013-07-30 ENCOUNTER — Encounter: Payer: Self-pay | Admitting: Internal Medicine

## 2013-07-30 ENCOUNTER — Telehealth: Payer: Self-pay | Admitting: Internal Medicine

## 2013-07-30 ENCOUNTER — Ambulatory Visit (INDEPENDENT_AMBULATORY_CARE_PROVIDER_SITE_OTHER): Payer: Medicare Other | Admitting: Internal Medicine

## 2013-07-30 VITALS — BP 194/100 | HR 92 | Temp 98.6°F | Wt 110.0 lb

## 2013-07-30 DIAGNOSIS — R413 Other amnesia: Secondary | ICD-10-CM

## 2013-07-30 DIAGNOSIS — Z8719 Personal history of other diseases of the digestive system: Secondary | ICD-10-CM

## 2013-07-30 DIAGNOSIS — R197 Diarrhea, unspecified: Secondary | ICD-10-CM

## 2013-07-30 LAB — CBC WITH DIFFERENTIAL/PLATELET
Basophils Absolute: 0 10*3/uL (ref 0.0–0.1)
Basophils Relative: 0 % (ref 0–1)
Eosinophils Absolute: 0.1 10*3/uL (ref 0.0–0.7)
Eosinophils Relative: 1 % (ref 0–5)
HCT: 40.5 % (ref 36.0–46.0)
Hemoglobin: 13.5 g/dL (ref 12.0–15.0)
LYMPHS ABS: 1.5 10*3/uL (ref 0.7–4.0)
LYMPHS PCT: 24 % (ref 12–46)
MCH: 30.2 pg (ref 26.0–34.0)
MCHC: 33.3 g/dL (ref 30.0–36.0)
MCV: 90.6 fL (ref 78.0–100.0)
Monocytes Absolute: 0.7 10*3/uL (ref 0.1–1.0)
Monocytes Relative: 11 % (ref 3–12)
NEUTROS ABS: 4.1 10*3/uL (ref 1.7–7.7)
NEUTROS PCT: 64 % (ref 43–77)
PLATELETS: 356 10*3/uL (ref 150–400)
RBC: 4.47 MIL/uL (ref 3.87–5.11)
RDW: 14.2 % (ref 11.5–15.5)
WBC: 6.4 10*3/uL (ref 4.0–10.5)

## 2013-07-30 LAB — HEMOCCULT GUIAC POC 1CARD (OFFICE): Fecal Occult Blood, POC: NEGATIVE

## 2013-07-30 NOTE — Telephone Encounter (Signed)
Dr. Renold Genta would like to see patient this afternoon.  Working patient in at [place] today r/t persistent diarrhea.  Caregiver states that she was last given Miralax on Sunday.  States the diarrhea is black.

## 2013-07-30 NOTE — Patient Instructions (Signed)
Stay with clear liquids for 48 hours. Family is to call with progress report at that time. Lab work is pending. Hold MiraLAX

## 2013-07-30 NOTE — Progress Notes (Signed)
   Subjective:    Patient ID: Dominique Frank, female    DOB: Feb 27, 1914, 80 y.o.   MRN: [id number]  HPI Patient accompanied by her son, Jenny Reichmann and her caretaker. They called today saying she was having issues with diarrhea. Previously was seen for constipation earlier this month and apparently had been doing well on MiraLAX. Caretaker says she does well on Paxil been less agitated but her son thinks it's causing issues for her including diarrhea. He says he doesn't want her to take Paxil. They say diarrhea has been black at times. Appetite is fair. No fever.    Review of Systems     Objective:   Physical Exam Abdomen is distended. Bowel sounds are increased. No tenderness appreciated. Stool is tan and is guaiac negative. No impaction identified on rectal exam       Assessment & Plan:  Diarrhea  Dementia-agitation treated with Paxil and apparently responded fairly well according to caretaker  History of constipation has been receiving daily MiraLAX until recently  Plan: Sent for KUB flat and upright abdominal films  ADD: Patient has nonobstructive bowel gas parent with a lot of gas. Findings suggestive of diarrhea.  Patient was placed on clear liquids for 48 hours and family is to call with progress report then. Hold MiraLAX for now. CBC and b-met pending.

## 2013-07-31 LAB — BASIC METABOLIC PANEL
BUN: 11 mg/dL (ref 6–23)
CHLORIDE: 97 meq/L (ref 96–112)
CO2: 28 meq/L (ref 19–32)
Calcium: 9.6 mg/dL (ref 8.4–10.5)
Creat: 0.81 mg/dL (ref 0.50–1.10)
Glucose, Bld: 90 mg/dL (ref 70–99)
Potassium: 4.4 mEq/L (ref 3.5–5.3)
SODIUM: 134 meq/L — AB (ref 135–145)

## 2013-08-02 ENCOUNTER — Telehealth: Payer: Self-pay | Admitting: Internal Medicine

## 2013-08-02 NOTE — Telephone Encounter (Signed)
Caretaker contacted by my nurse yesterday. Apparently diarrhea has resolved. Still has a lot of flatus. Have recommended Gas-X for that. Advance diet slowly.

## 2013-08-10 ENCOUNTER — Telehealth: Payer: Self-pay | Admitting: Internal Medicine

## 2013-08-10 DIAGNOSIS — R197 Diarrhea, unspecified: Secondary | ICD-10-CM

## 2013-08-10 NOTE — Telephone Encounter (Signed)
Spoke with Dr. Renold Genta; verbal order to advise clear liquid diet, call in Lomotil 1 bid prn diarrhea #10.  Refer to GI for consult.

## 2013-08-10 NOTE — Telephone Encounter (Signed)
Spoke with son, [place] and advised of Dr. Verlene Mayer instructions and the Lomotil prescription. Also advised we will refer to GI for a consult.    Spoke with [facility] in [place];  Lomotil 1 bid prn diarrhea #10, no refills.  Will refer to Vaughan Basta to put a referral into [facility] for [facility]

## 2013-08-16 ENCOUNTER — Telehealth: Payer: Self-pay

## 2013-08-16 ENCOUNTER — Encounter: Payer: Self-pay | Admitting: Physician Assistant

## 2013-08-16 ENCOUNTER — Ambulatory Visit (INDEPENDENT_AMBULATORY_CARE_PROVIDER_SITE_OTHER): Payer: Medicare Other | Admitting: Physician Assistant

## 2013-08-16 VITALS — BP 150/70 | HR 70 | Ht <= 58 in | Wt 108.8 lb

## 2013-08-16 DIAGNOSIS — R634 Abnormal weight loss: Secondary | ICD-10-CM

## 2013-08-16 DIAGNOSIS — Z9049 Acquired absence of other specified parts of digestive tract: Secondary | ICD-10-CM | POA: Insufficient documentation

## 2013-08-16 DIAGNOSIS — R197 Diarrhea, unspecified: Secondary | ICD-10-CM

## 2013-08-16 DIAGNOSIS — Z9189 Other specified personal risk factors, not elsewhere classified: Secondary | ICD-10-CM

## 2013-08-16 DIAGNOSIS — R1013 Epigastric pain: Secondary | ICD-10-CM

## 2013-08-16 MED ORDER — SACCHAROMYCES BOULARDII 250 MG PO CAPS: 250.0000 mg | ORAL_CAPSULE | Freq: Two times a day (BID) | ORAL | Status: DC

## 2013-08-16 MED ORDER — METRONIDAZOLE 250 MG PO TABS: 250.0000 mg | ORAL_TABLET | Freq: Four times a day (QID) | ORAL | Status: DC

## 2013-08-16 MED ORDER — DIPHENOXYLATE-ATROPINE 2.5-0.025 MG PO TABS: 1.0000 | ORAL_TABLET | Freq: Every day | ORAL | Status: DC | PRN

## 2013-08-16 NOTE — Telephone Encounter (Signed)
,,,,,,, 

## 2013-08-16 NOTE — Progress Notes (Signed)
Reviewed and agree with management plan.  Twan Harkin T. Jeron Grahn, MD FACG 

## 2013-08-16 NOTE — Progress Notes (Signed)
Subjective:    Patient ID: Dominique Frank, female    DOB: 1914/05/29, 80 y.o.   MRN: [id number]  HPI  Dominique Frank is a pleasant 80 year old white female referred by Dr.Baxley. She is new to GI today, and is brought in with complaints of diarrhea. She has history of dementia, hypertension, glaucoma. She is accompanied by her son who gives most of the history and an aide who cares for her during the day. Apparently she had previously had significant problems with constipation and was even admitted once within the past couple of years with a fecal impaction. Now over the past 2 months she has been having persistent problems with diarrhea. She has seen Dr. Renold Genta and had labs done in January which were unremarkable. She also had plain abdominal films which showed liquid stool in the bowel on 07/30/2013, no obstruction. She had recently been given a prescription for Lomotil which her family has been using sparingly and this is helpful. Without the Lomotil she was having 3-4 explosive diarrhea bowel movements per day often dark and complaining of "feeling sick". She appears to have abdominal discomfort and cramping with these episodes but does not complain much. Her appetite has been fairly good though her weight is down 4-5 pounds over the past few months. She has not had any nausea or vomiting. No complaints of heartburn or indigestion, no fever or chills. Has not had any antibiotics over the past few months and the only new medication is Paxil. Patient has not had prior colonoscopy. Her son mentions that she had some sort of bowel surgery remotely many many years ago but does not remember what this was done for.     Review of Systems  Constitutional: Positive for appetite change and unexpected weight change.  HENT: Negative.   Eyes: Negative.   Respiratory: Negative.   Cardiovascular: Negative.   Gastrointestinal: Positive for abdominal pain and diarrhea.  Endocrine: Negative.   Genitourinary: Negative.     Musculoskeletal: Negative.   Skin: Negative.   Allergic/Immunologic: Negative.   Neurological: Negative.   Hematological: Negative.   Psychiatric/Behavioral: Positive for confusion and dysphoric mood.   Outpatient Prescriptions Prior to Visit  Medication Sig Dispense Refill  . aspirin 81 MG EC tablet Take 81 mg by mouth daily.        . calcium carbonate (OS-CAL) 1250 MG chewable tablet Chew 1 tablet by mouth daily.        . COMBIGAN 0.2-0.5 % ophthalmic solution Place 1 drop into both eyes.       Marland Kitchen omeprazole (PRILOSEC) 20 MG capsule Take 20 mg by mouth daily.      Marland Kitchen PARoxetine (PAXIL) 10 MG tablet Take 1 tablet (10 mg total) by mouth daily.  30 tablet  5  . TRAVATAN Z 0.004 % ophthalmic solution Place 1 drop into both eyes at bedtime.       . docusate sodium (COLACE) 50 MG capsule Take 100 mg by mouth 2 (two) times daily.      Marland Kitchen OLANZapine (ZYPREXA) 2.5 MG tablet Take 1 tablet (2.5 mg total) by mouth at bedtime.  30 tablet  2   No facility-administered medications prior to visit.   Allergies  Allergen Reactions  . Codeine Nausea Only       Patient Active Problem List   Diagnosis Date Noted  . History of bowel resection 08/16/2013  . Hearing loss 07/09/2013  . Decreased appetite 04/23/2012  . Dementia 11/24/2010  . HTN (hypertension) 11/24/2010  .  Constipation 11/24/2010  . Glaucoma 11/24/2010   History  Substance Use Topics  . Smoking status: Never Smoker   . Smokeless tobacco: Never Used  . Alcohol Use: No   family history is negative for Colon cancer.  Objective:   Physical Exam well-developed elderly white female in no acute distress accompanied by her son and 2 aides. Blood pressure 150/70 pulse 70 height 4 foot 9 weight 108. HEENT; nontraumatic normocephalic EOMI PERRLA sclera anicteric, Supple; no JVD, Cardiovascular; regular rate and rhythm with S1-S2 there's no murmur or gallop, Pulmonary; clear bilaterally, Abdomen;  low midline incisional scar bowel sounds  are hyperactive nondistended there is no palpable mass or hepatosplenomegaly and no focal tenderness, Rectal; exam no external lesion noted she has anal stenosis and had a lot of discomfort even with digital exam which is limited unable to palpate impaction or other lesion stool heme-negative, Extremities; no clubbing cyanosis or edema skin warm and dry, Psych; patient somewhat emotionally labile, cooperative        Assessment & Plan:   #95   80 year old female with 2 month history of diarrhea and abdominal cramping with associated weight loss. Etiology is not clear we'll rule out infectious entities and occult malignancy and underlying colitis.. If initial workup is negative would consider medication-induced diarrhea i.e. Paxil   #2 dementia  #3 hypertension  #4 glaucoma  #5 remote intestinal surgery type unclear   Plan ; stool pathogen panel  Schedule for CT scan of the abdomen and pelvis with contrast  Start  Florastor one by mouth twice daily x1 month  We'll give her an empiric course of Flagyl 250 mg 4 times daily x2 weeks  Family cautioned to use the Lomotil sparingly and only after she has had 2 diarrheal stools and a 24-hour period  Further workup pending response to above and CT results

## 2013-08-16 NOTE — Patient Instructions (Signed)
We have given you a prescription to take to the pharmacy for Lomotil tablets. We  Sent prescriptions for Flagyl 250 mg and Florastor. Samples also of Florastor have been given. Our lab has given you instructions and a container for the stool study.   You have been scheduled for a CT scan of the abdomen and pelvis at [facility] ([address]---this is in the same building as [facility]).   You are scheduled on 08-20-2013 at  1:30 PM. You should arrive at 1:15 PM prior to your appointment time for registration. Please follow the written instructions below on the day of your exam:  WARNING: IF YOU ARE ALLERGIC TO IODINE/X-RAY DYE, PLEASE NOTIFY RADIOLOGY IMMEDIATELY AT [phone]! YOU WILL BE GIVEN A 13 HOUR PREMEDICATION PREP.  1) Do not eat or drink anything after 9:30 am  (4 hours prior to your test) 2) You have been given 2 bottles of oral contrast to drink. The solution may taste better if refrigerated, but do NOT add ice or any other liquid to this solution. Shake well before drinking.    Drink 1 bottle of contrast @ 11:30 am (2 hours prior to your exam)  Drink 1 bottle of contrast @ 12:30 PM  (1 hour prior to your exam)  You may take any medications as prescribed with a small amount of water except for the following: Metformin, Glucophage, Glucovance, Avandamet, Riomet, Fortamet, Actoplus Met, Janumet, Glumetza or Metaglip. The above medications must be held the day of the exam AND 48 hours after the exam.  The purpose of you drinking the oral contrast is to aid in the visualization of your intestinal tract. The contrast solution may cause some diarrhea. Before your exam is started, you will be given a small amount of fluid to drink. Depending on your individual set of symptoms, you may also receive an intravenous injection of x-ray contrast/dye. Plan on being at [facility] [place] for 30 minutes or long, depending on the type of exam you are having performed.  If  you have any questions regarding your exam or if you need to reschedule, you may call the CT department at [phone] between the hours of 8:00 am and 5:00 pm, Monday-Friday.  ________________________________________________________________________

## 2013-08-17 ENCOUNTER — Other Ambulatory Visit: Payer: Medicare Other

## 2013-08-17 DIAGNOSIS — R1013 Epigastric pain: Secondary | ICD-10-CM

## 2013-08-17 DIAGNOSIS — R197 Diarrhea, unspecified: Secondary | ICD-10-CM

## 2013-08-17 DIAGNOSIS — R634 Abnormal weight loss: Secondary | ICD-10-CM

## 2013-08-20 ENCOUNTER — Other Ambulatory Visit: Payer: Medicare Other

## 2013-08-20 LAB — GASTROINTESTINAL PATHOGEN PANEL PCR
C. DIFFICILE TOX A/B, PCR: NEGATIVE
CRYPTOSPORIDIUM, PCR: NEGATIVE
Campylobacter, PCR: NEGATIVE
E coli (ETEC) LT/ST PCR: NEGATIVE
E coli (STEC) stx1/stx2, PCR: NEGATIVE
E coli 0157, PCR: NEGATIVE
GIARDIA LAMBLIA, PCR: NEGATIVE
NOROVIRUS, PCR: NEGATIVE
ROTAVIRUS, PCR: NEGATIVE
SALMONELLA, PCR: NEGATIVE
SHIGELLA, PCR: NEGATIVE

## 2013-08-24 ENCOUNTER — Ambulatory Visit (INDEPENDENT_AMBULATORY_CARE_PROVIDER_SITE_OTHER)
Admission: RE | Admit: 2013-08-24 | Discharge: 2013-08-24 | Disposition: A | Discharge status: Home / Self Care | Payer: Medicare Other | Source: Ambulatory Visit | Attending: Physician Assistant | Admitting: Physician Assistant

## 2013-08-24 DIAGNOSIS — R634 Abnormal weight loss: Secondary | ICD-10-CM

## 2013-08-24 DIAGNOSIS — R1013 Epigastric pain: Secondary | ICD-10-CM

## 2013-08-24 DIAGNOSIS — R197 Diarrhea, unspecified: Secondary | ICD-10-CM

## 2013-08-24 MED ORDER — IOHEXOL 300 MG/ML  SOLN
80.0000 mL | Freq: Once | INTRAMUSCULAR | Status: AC | PRN
  Administered 2013-08-24: 80 mL via INTRAVENOUS

## 2013-11-22 ENCOUNTER — Encounter: Payer: Self-pay | Admitting: Internal Medicine

## 2013-11-22 ENCOUNTER — Ambulatory Visit (INDEPENDENT_AMBULATORY_CARE_PROVIDER_SITE_OTHER): Payer: Medicare Other | Admitting: Internal Medicine

## 2013-11-22 VITALS — BP 156/84 | HR 68 | Temp 98.5°F | Wt 110.0 lb

## 2013-11-22 DIAGNOSIS — H00019 Hordeolum externum unspecified eye, unspecified eyelid: Secondary | ICD-10-CM

## 2013-11-22 DIAGNOSIS — S59919A Unspecified injury of unspecified forearm, initial encounter: Secondary | ICD-10-CM

## 2013-11-22 DIAGNOSIS — L089 Local infection of the skin and subcutaneous tissue, unspecified: Secondary | ICD-10-CM

## 2013-11-22 DIAGNOSIS — S50919A Unspecified superficial injury of unspecified forearm, initial encounter: Secondary | ICD-10-CM

## 2013-11-22 DIAGNOSIS — S6990XA Unspecified injury of unspecified wrist, hand and finger(s), initial encounter: Secondary | ICD-10-CM

## 2013-11-22 DIAGNOSIS — S59909A Unspecified injury of unspecified elbow, initial encounter: Secondary | ICD-10-CM

## 2013-11-22 DIAGNOSIS — H00013 Hordeolum externum right eye, unspecified eyelid: Secondary | ICD-10-CM

## 2013-11-22 DIAGNOSIS — S50902A Unspecified superficial injury of left elbow, initial encounter: Secondary | ICD-10-CM

## 2013-11-22 MED ORDER — BACITRACIN-NEOMYCIN-POLYMYXIN 400-5-5000 EX OINT: 1.0000 "application " | TOPICAL_OINTMENT | Freq: Two times a day (BID) | CUTANEOUS | Status: DC

## 2013-11-22 MED ORDER — CEPHALEXIN 500 MG PO CAPS: 500.0000 mg | ORAL_CAPSULE | Freq: Four times a day (QID) | ORAL | Status: DC

## 2013-11-22 NOTE — Progress Notes (Signed)
   Subjective:    Patient ID: Dominique Frank, female    DOB: 11/02/2013, 80 y.o.   MRN: [id number]  HPI   Family called saying she had an infected left elbow. Patient has dementia. She does not know how she injured her elbow but there is an abrasion on the elbow. It is warm to touch. Not actively draining. Apparently has been there for several days. She's been outside in the [place] and also is at home. No one saw her fall. I wondered if she could have burned it. But really doesn't look like a burn. She also has hordeolum right eye.    Review of Systems     Objective:   Physical Exam  Hordeolum right eye. Extraocular movements are full. No drainage from 5. Abraded area left elbow with surrounding tissue that is red and hard. No drainage noted. It is warm to touch.      Assessment & Plan:  Infected left elbow  Hordeolum right eye  Plan: Keflex 500 mg 4 times daily for 7 days. Neosporin Ophthalmic Ointment to use right eye every 12 hours. Apply warm hot compresses to hordeolum 20 minutes twice daily. Tetanus immunization is up-to-date.

## 2013-11-22 NOTE — Patient Instructions (Signed)
Apply eye ointment q 12 hours. Take Keflex for infected elbow 4 times a day x 7 days.

## 2014-02-15 ENCOUNTER — Encounter: Payer: Self-pay | Admitting: Internal Medicine

## 2014-02-15 ENCOUNTER — Ambulatory Visit (INDEPENDENT_AMBULATORY_CARE_PROVIDER_SITE_OTHER): Payer: Medicare Other | Admitting: Internal Medicine

## 2014-02-15 VITALS — BP 132/74 | HR 64 | Temp 98.2°F | Wt 111.5 lb

## 2014-02-15 DIAGNOSIS — B029 Zoster without complications: Secondary | ICD-10-CM

## 2014-02-15 MED ORDER — VALACYCLOVIR HCL 1 G PO TABS: 1000.0000 mg | ORAL_TABLET | Freq: Three times a day (TID) | ORAL | Status: DC

## 2014-02-15 NOTE — Progress Notes (Signed)
   Subjective:    Patient ID: Dominique Frank, female    DOB: [birth year], 80 y.o.   MRN: [id number]  HPI Several day history of rash on left anterior leg which has been uncomfortable according to caretaker. Says lesions  appeared over  2 or 3 days. Also complaining of decreased hearing.    Review of Systems     Objective:   Physical Exam    Discrete vesicular papules left anterior leg. Slight amount of cerumen right TM which was removed with curette. No blockage of ear canals.       Assessment & Plan:  Herpes zoster  Hearing loss  Plan: Valtrex 1 gram by mouth 3 times daily for 7 days. Apply calamine lotion to lesions 2-3 times daily.  15 minutes spent with patient

## 2014-02-15 NOTE — Patient Instructions (Signed)
Apply calamine lotion to lesions. Take Valtrex as directed.

## 2014-02-25 ENCOUNTER — Other Ambulatory Visit: Payer: Self-pay | Admitting: Internal Medicine

## 2014-02-25 NOTE — Telephone Encounter (Signed)
There is no need to refill. She has been treated adequately. If rash persistent and not improving, needs to see Dermatologist. Please speak only with son.

## 2014-02-25 NOTE — Telephone Encounter (Signed)
Spoke with [name], who didn't know who requested refill of Valtrex. He seemed to think the rash was getting better.

## 2014-03-06 ENCOUNTER — Telehealth: Payer: Self-pay

## 2014-03-06 NOTE — Telephone Encounter (Signed)
Phone call from Abigail Butts with [facility] stating she made a home visit to Dominique Frank on Monday 03/04/2014 and noted that she was Sob, ankles were swollen and abdomen was distended. Bp 142/ 84, resp 24. She advised son Lowella Dell to call Dr. Renold Genta the next day. Mr. Ellery calls today and I advised him Dr. Renold Genta is not in the office to see patient today. Advised him to take her to ED instead of waiting for an appointment here. He is agreeable to this plan.

## 2014-04-02 ENCOUNTER — Encounter: Payer: Self-pay | Admitting: Internal Medicine

## 2014-04-02 ENCOUNTER — Ambulatory Visit (INDEPENDENT_AMBULATORY_CARE_PROVIDER_SITE_OTHER): Payer: Medicare Other | Admitting: Internal Medicine

## 2014-04-02 VITALS — BP 178/88 | HR 98 | Temp 98.1°F | Resp 16 | Wt 109.0 lb

## 2014-04-02 DIAGNOSIS — H9193 Unspecified hearing loss, bilateral: Secondary | ICD-10-CM

## 2014-04-02 DIAGNOSIS — J069 Acute upper respiratory infection, unspecified: Secondary | ICD-10-CM

## 2014-04-02 DIAGNOSIS — H9190 Unspecified hearing loss, unspecified ear: Secondary | ICD-10-CM

## 2014-04-02 MED ORDER — BENZONATATE 100 MG PO CAPS: 100.0000 mg | ORAL_CAPSULE | Freq: Three times a day (TID) | ORAL | Status: DC | PRN

## 2014-04-02 MED ORDER — AZITHROMYCIN 250 MG PO TABS: ORAL_TABLET | ORAL | Status: DC

## 2014-04-02 NOTE — Patient Instructions (Signed)
Zithromax Z Pak take as directed. Tessalon perles 3 times daily for cough. Rest and drink plenty of fluids

## 2014-04-02 NOTE — Progress Notes (Signed)
   Subjective:    Patient ID: Dominique Frank, female    DOB: 11/07/1913, 80 y.o.   MRN: [id number]  HPI  Four day history of URI symtoms. No documented fever. Congested cough. Decreased appetite.    Review of Systems     Objective:   Physical Exam Pharynx and TMs are clear. No significant amt of cerumen in ear canals. Hard of hearing. Neck without adenopathy. Chest clear to auscultation. Deep congested cough.       Assessment & Plan:  Acute URI  Zithromax 2 po day 1 then 1 po days 2-5 Tessalon perles 100 mg one po tid prn cough

## 2014-04-16 ENCOUNTER — Encounter: Payer: Self-pay | Admitting: Internal Medicine

## 2014-04-16 ENCOUNTER — Ambulatory Visit (INDEPENDENT_AMBULATORY_CARE_PROVIDER_SITE_OTHER): Payer: Medicare Other | Admitting: Internal Medicine

## 2014-04-16 VITALS — BP 142/90 | HR 60 | Temp 97.5°F | Wt 109.0 lb

## 2014-04-16 DIAGNOSIS — N3001 Acute cystitis with hematuria: Secondary | ICD-10-CM

## 2014-04-16 DIAGNOSIS — R319 Hematuria, unspecified: Secondary | ICD-10-CM

## 2014-04-16 LAB — POCT URINALYSIS DIPSTICK
BILIRUBIN UA: NEGATIVE
GLUCOSE UA: NEGATIVE
Ketones, UA: NEGATIVE
NITRITE UA: NEGATIVE
Protein, UA: POSITIVE
Spec Grav, UA: 1.015
Urobilinogen, UA: NEGATIVE
pH, UA: 6.5

## 2014-04-16 MED ORDER — CEPHALEXIN 250 MG PO CAPS: 500.0000 mg | ORAL_CAPSULE | Freq: Four times a day (QID) | ORAL | Status: DC

## 2014-04-16 NOTE — Progress Notes (Signed)
   Subjective:    Patient ID: Dominique Frank, female    DOB: August 17, 1913, 80 y.o.   MRN: [id number]  HPI  Brought in today by son, [facility]. She has had blood in her urine. Yesterday one of her caretakers was found dead at patient's home apparently of natural causes. Patient remained pleasantly demented. Doesn't know day of the week. He is cooperative. Had recent URI that resolved with Zithromax. Urinalysis abnormal today. Culture taken.  Son also indicates that after she left here at last visit with an upper respiratory infection, she developed slurred speech lasting about 24 hours. Asked if she was taking an aspirin daily and he is not sure but will check on this.    Review of Systems     Objective:   Physical Exam  Chest clear to auscultation. Cardiac exam regular rate and rhythm normal S1 and S2. She is alert but not oriented. No CVA tenderness. No complaints of dysuria.      Assessment & Plan:  Urinary tract infection  Plan: Keflex 250 mg by mouth 4 times daily for 10 days. Culture is pending.  Son asked that we contact hospice and p[facility] to care for some help in the home. We will make referral.  Son is to make sure patient is taking aspirin daily.  25 minutes spent with patient and son

## 2014-04-16 NOTE — Patient Instructions (Addendum)
Take Keflex four times daily x 10 days. Culture pending. Hospice referral to be made.

## 2014-04-17 LAB — URINALYSIS, MICROSCOPIC ONLY
Casts: NONE SEEN
Crystals: NONE SEEN
RBC / HPF: >50 RBC/hpf — AB (ref <3)
Squamous Epithelial / LPF: NONE SEEN

## 2014-04-18 LAB — URINE CULTURE

## 2014-04-19 ENCOUNTER — Telehealth: Payer: Self-pay

## 2014-04-19 MED ORDER — CIPROFLOXACIN HCL 250 MG PO TABS: 250.0000 mg | ORAL_TABLET | Freq: Two times a day (BID) | ORAL | Status: DC

## 2014-04-19 NOTE — Telephone Encounter (Signed)
Message copied by Amado Coe on Fri Apr 19, 2014 11:29 AM ------      Message from: Elby Showers      Created: Fri Apr 19, 2014 10:44 AM       UTI is resistant to Keflex. Switch to Cipro 250 mg bid x 7 days. Recheck urine in one week. ------

## 2014-04-19 NOTE — Telephone Encounter (Signed)
Yes fill Cipro and take as directed. See in one week. Need records from hospital.

## 2014-04-19 NOTE — Telephone Encounter (Signed)
Cipro sent to pharmacy.  Left message informing patient and her family.  Follow up appointment 04/25/2014 at 1215pm.

## 2014-04-19 NOTE — Telephone Encounter (Signed)
Patient son wanted to let us know that Dominique Frank is in the [facility] hospital now.  She had a fall and got a bump on her head and hurt her shoulder.  Her blood pressure was also elevated.  Patient son also said that they checked her urine and said it was clear.  Do they still need to get the cipro?  They have an appointment next week.

## 2014-04-19 NOTE — Telephone Encounter (Signed)
Left message for patient to fill cipro as directed and to bring hospital records to appointment.

## 2014-04-23 DIAGNOSIS — W19XXXA Unspecified fall, initial encounter: Secondary | ICD-10-CM | POA: Insufficient documentation

## 2014-04-23 DIAGNOSIS — E871 Hypo-osmolality and hyponatremia: Secondary | ICD-10-CM | POA: Insufficient documentation

## 2014-04-25 ENCOUNTER — Encounter: Payer: Self-pay | Admitting: Internal Medicine

## 2014-04-25 ENCOUNTER — Ambulatory Visit (INDEPENDENT_AMBULATORY_CARE_PROVIDER_SITE_OTHER): Payer: Medicare Other | Admitting: Internal Medicine

## 2014-04-25 VITALS — BP 150/100 | HR 70 | Temp 98.1°F | Wt 108.0 lb

## 2014-04-25 DIAGNOSIS — R413 Other amnesia: Secondary | ICD-10-CM

## 2014-04-25 DIAGNOSIS — N390 Urinary tract infection, site not specified: Secondary | ICD-10-CM

## 2014-04-25 DIAGNOSIS — J069 Acute upper respiratory infection, unspecified: Secondary | ICD-10-CM

## 2014-04-25 DIAGNOSIS — Z23 Encounter for immunization: Secondary | ICD-10-CM

## 2014-04-25 DIAGNOSIS — R5383 Other fatigue: Secondary | ICD-10-CM

## 2014-04-25 LAB — CBC WITH DIFFERENTIAL/PLATELET
Basophils Absolute: 0.1 10*3/uL (ref 0.0–0.1)
Basophils Relative: 1 % (ref 0–1)
Eosinophils Absolute: 0.1 10*3/uL (ref 0.0–0.7)
Eosinophils Relative: 2 % (ref 0–5)
HEMATOCRIT: 38.3 % (ref 36.0–46.0)
HEMOGLOBIN: 12.5 g/dL (ref 12.0–15.0)
LYMPHS PCT: 20 % (ref 12–46)
Lymphs Abs: 1.1 10*3/uL (ref 0.7–4.0)
MCH: 30.3 pg (ref 26.0–34.0)
MCHC: 32.6 g/dL (ref 30.0–36.0)
MCV: 93 fL (ref 78.0–100.0)
MONO ABS: 0.7 10*3/uL (ref 0.1–1.0)
Monocytes Relative: 12 % (ref 3–12)
NEUTROS ABS: 3.6 10*3/uL (ref 1.7–7.7)
Neutrophils Relative %: 65 % (ref 43–77)
Platelets: 334 10*3/uL (ref 150–400)
RBC: 4.12 MIL/uL (ref 3.87–5.11)
RDW: 14.5 % (ref 11.5–15.5)
WBC: 5.6 10*3/uL (ref 4.0–10.5)

## 2014-04-25 LAB — POCT URINALYSIS DIPSTICK
Bilirubin, UA: NEGATIVE
Blood, UA: NEGATIVE
GLUCOSE UA: NEGATIVE
KETONES UA: NEGATIVE
Leukocytes, UA: NEGATIVE
Nitrite, UA: NEGATIVE
Protein, UA: NEGATIVE
Spec Grav, UA: <=1.005
Urobilinogen, UA: NEGATIVE
pH, UA: 7

## 2014-04-25 LAB — BASIC METABOLIC PANEL
BUN: 9 mg/dL (ref 6–23)
CHLORIDE: 97 meq/L (ref 96–112)
CO2: 28 mEq/L (ref 19–32)
Calcium: 9.1 mg/dL (ref 8.4–10.5)
Creat: 0.62 mg/dL (ref 0.50–1.10)
Glucose, Bld: 70 mg/dL (ref 70–99)
POTASSIUM: 4.1 meq/L (ref 3.5–5.3)
Sodium: 133 mEq/L — ABNORMAL LOW (ref 135–145)

## 2014-04-25 MED ORDER — AZITHROMYCIN 250 MG PO TABS: ORAL_TABLET | ORAL | Status: DC

## 2014-05-03 ENCOUNTER — Telehealth: Payer: Self-pay | Admitting: Internal Medicine

## 2014-05-03 ENCOUNTER — Telehealth: Payer: Self-pay

## 2014-05-03 ENCOUNTER — Ambulatory Visit
Admission: RE | Admit: 2014-05-03 | Discharge: 2014-05-03 | Disposition: A | Discharge status: Home / Self Care | Payer: Medicare Other | Source: Ambulatory Visit | Attending: Internal Medicine | Admitting: Internal Medicine

## 2014-05-03 DIAGNOSIS — R059 Cough, unspecified: Secondary | ICD-10-CM

## 2014-05-03 DIAGNOSIS — R05 Cough: Secondary | ICD-10-CM

## 2014-05-03 MED ORDER — AZITHROMYCIN 250 MG PO TABS: ORAL_TABLET | ORAL | Status: DC

## 2014-05-03 NOTE — Telephone Encounter (Signed)
Spoke with Annie May, she is aware Dr Renold Genta wants patient to have a chest xray.  Order sent.  Also sent rx for zpak to [facility].

## 2014-05-03 NOTE — Telephone Encounter (Signed)
Please have her go for CXR. Dx is  cough.  Call in Zithromax Z pak 2 tabs po day 1 followed by 1 tab po days 2-5.

## 2014-05-03 NOTE — Telephone Encounter (Signed)
States patient is coughing constantly.  She has no fever.  States she finished all of her antibiotics.  She has been giving her Daquil.   She coughs until she expels a thin, brownish liquid.  She's been coughing x 1 week.  Wants to know if you feel that she needs something called in because she feels the coughing is getting worse.  States the patient holds her chest after she coughs for a while.  States the cough is dry and doesn't sound congested.  It's a deep sounding cough.    Please advise what you want to do.

## 2014-05-03 NOTE — Telephone Encounter (Signed)
Chest xray results from [facility] imaging.  No acute cardio pulmonary abnormalities.  Chronic bibasilar scaring or atelectasis.

## 2014-05-25 ENCOUNTER — Other Ambulatory Visit: Payer: Self-pay | Admitting: Internal Medicine

## 2014-06-03 ENCOUNTER — Encounter: Payer: Self-pay | Admitting: Internal Medicine

## 2014-06-03 NOTE — Progress Notes (Signed)
   Subjective:    Patient ID: Dominique Frank, female    DOB: 22-Jan-1914, 80 y.o.   MRN: [id number]  HPI  Patient was seen recently at [facility] location. Was hospitalized with suspected TIA. Was found to have a urinary tract infection. Is here today for follow-up. She has dementia.    Review of Systems     Objective:   Physical Exam  She is pleasant and alert. Skin warm and dry. Nodes none. Chest clear to auscultation. Cardiac exam regular rate and rhythm. Extremities without edema. Urinalysis is normal. B- met is normal with the exception of a sodium of 133. Potassium is normal. CBC is normal.      Assessment & Plan:  Memory loss  Recent urinary tract infection  Recent TIA  Elevated blood pressure-usually blood pressure is not quite as high but she may be upset because caretaker died in her home recently  Plan: Return as needed.

## 2014-06-03 NOTE — Patient Instructions (Addendum)
Urinary tract infection has resolved. Return as needed.

## 2014-07-07 ENCOUNTER — Emergency Department (HOSPITAL_COMMUNITY)
Admission: EM | Admit: 2014-07-07 | Discharge: 2014-07-07 | Disposition: A | Discharge status: Home / Self Care | Payer: Medicare Other | Source: Home / Self Care | Attending: Family Medicine | Admitting: Family Medicine

## 2014-07-07 ENCOUNTER — Encounter (HOSPITAL_COMMUNITY): Payer: Self-pay

## 2014-07-07 ENCOUNTER — Emergency Department (INDEPENDENT_AMBULATORY_CARE_PROVIDER_SITE_OTHER): Payer: Medicare Other

## 2014-07-07 DIAGNOSIS — S62629A Displaced fracture of medial phalanx of unspecified finger, initial encounter for closed fracture: Secondary | ICD-10-CM

## 2014-07-07 DIAGNOSIS — M2540 Effusion, unspecified joint: Secondary | ICD-10-CM | POA: Diagnosis not present

## 2014-07-07 DIAGNOSIS — M153 Secondary multiple arthritis: Secondary | ICD-10-CM

## 2014-07-07 DIAGNOSIS — M19041 Primary osteoarthritis, right hand: Secondary | ICD-10-CM | POA: Diagnosis not present

## 2014-07-07 DIAGNOSIS — M7989 Other specified soft tissue disorders: Secondary | ICD-10-CM | POA: Diagnosis not present

## 2014-07-07 NOTE — ED Provider Notes (Signed)
CSN: [id number]     Arrival date & time 07/07/14  1037 History   First MD Initiated Contact with Patient 07/07/14 1117     Chief Complaint  Patient presents with  . Hand Pain   (Consider location/radiation/quality/duration/timing/severity/associated sxs/prior Treatment) HPI Comments: 81 year old female with dementia is accompanied by her son and caretaker and brought to the urgent care for evaluation of pain swelling and discoloration of the right long finger. This began approximately 2 days ago and has progressed over the past 24 hours. There is tense swelling over the proximal and middle phalanges as well as the PIP joint. There is a purplish/ecchymotic discoloration of the digit as well. There is a decreased function in that she is unable to flex the finger. There is no known injury that she is able to recall.   Past Medical History  Diagnosis Date  . Hyperlipidemia   . Hypertension   . Osteopenia   . Constipation   . Anxiety   . Glaucoma   . Bowel obstruction    Past Surgical History  Procedure Laterality Date  . Abdominal hysterectomy    . Hernia repair    . Vag burch suspension    . Bowel obstruction      ? type   Family History  Problem Relation Age of Onset  . Colon cancer Neg Hx   . Stroke Mother    History  Substance Use Topics  . Smoking status: Never Smoker   . Smokeless tobacco: Never Used  . Alcohol Use: No   OB History    No data available     Review of Systems  Constitutional: Negative.   Musculoskeletal:       As per history of present illness  All other systems reviewed and are negative.   Allergies  Codeine  Home Medications   Prior to Admission medications   Medication Sig Start Date End Date Taking? Authorizing Provider  aspirin 81 MG EC tablet Take 81 mg by mouth daily.      Historical Provider, MD  azithromycin (ZITHROMAX) 250 MG tablet 2 tabs po day 1 followed by 1 tab po days 2-5 05/03/14   Elby Showers, MD  calcium carbonate  (OS-CAL) 1250 MG chewable tablet Chew 1 tablet by mouth daily.      Historical Provider, MD  COMBIGAN 0.2-0.5 % ophthalmic solution Place 1 drop into both eyes.  11/18/10   Historical Provider, MD  diphenoxylate-atropine (LOMOTIL) 2.5-0.025 MG per tablet Take 1 tablet by mouth daily as needed for diarrhea or loose stools. 08/16/13   Amy S Esterwood, PA-C  omeprazole (PRILOSEC) 20 MG capsule Take 20 mg by mouth daily.    Historical Provider, MD  PARoxetine (PAXIL) 10 MG tablet TAKE 1 TABLET BY MOUTH DAILY 05/25/14   Elby Showers, MD  saccharomyces boulardii (FLORASTOR) 250 MG capsule Take 1 capsule (250 mg total) by mouth 2 (two) times daily. 08/16/13   Amy S Esterwood, PA-C  TRAVATAN Z 0.004 % ophthalmic solution Place 1 drop into both eyes at bedtime.  11/18/10   Historical Provider, MD   BP 186/89 mmHg  Pulse 69  Temp(Src) 97.4 F (36.3 C) (Oral)  Resp 12  SpO2 98% Physical Exam  Constitutional: She appears well-developed and well-nourished. No distress.  Pulmonary/Chest: Effort normal. No respiratory distress.  Musculoskeletal:  Edema, ecchymotic discoloration, tenderness to the right middle finger involving the proximal and middle phalanges. The swelling and fluctuance circumscribes the digit.   Neurological: She is alert.  She exhibits normal muscle tone.  Alert, cooperative, obeys commands, pleasant disposition.  Skin: Skin is warm and dry.  Psychiatric: She has a normal mood and affect.  Nursing note and vitals reviewed.   ED Course  Procedures (including critical care time) Labs Review Labs Reviewed - No data to display  Imaging Review Dg Finger Middle Right  07/07/2014   CLINICAL DATA:  Pain, swelling and ecchymosis involving the along finger.  EXAM: RIGHT MIDDLE FINGER 2+V  COMPARISON:  None.  FINDINGS: There are moderate osteoarthritic type degenerative changes involving the DIP PIP P joints of the visualized fingers and also at the carpometacarpal joint of thumb. On the  lateral film there is a dorsal plate avulsion fracture and significant dorsal soft tissue swelling.  IMPRESSION: 1. Dorsal plate avulsion fracture involving the middle phalanx of the long finger. 2. Moderate osteoarthritic type degenerative changes.   Electronically Signed   By: Kalman Jewels M.D.   On: 07/07/2014 11:20     MDM   1. Avulsion fracture of middle phalanx of finger, closed, initial encounter   2. Painful swelling of joint   3. Secondary osteoarthritis of multiple sites      Splint in extension Elevate Cold compresses for 1-2 days F/U with Dr. Erlinda Hong, call tomorrow for appt Ibuprofen for pain   Janne Napoleon, NP 07/07/14 1146

## 2014-07-07 NOTE — Discharge Instructions (Signed)
Finger Fracture Fractures of fingers are breaks in the bones of the fingers. There are many types of fractures. There are different ways of treating these fractures. Your health care provider will discuss the best way to treat your fracture. CAUSES Traumatic injury is the main cause of broken fingers. These include:  Injuries while playing sports.  Workplace injuries.  Falls. RISK FACTORS Activities that can increase your risk of finger fractures include:  Sports.  Workplace activities that involve machinery.  A condition called osteoporosis, which can make your bones less dense and cause them to fracture more easily. SIGNS AND SYMPTOMS The main symptoms of a broken finger are pain and swelling within 15 minutes after the injury. Other symptoms include:  Bruising of your finger.  Stiffness of your finger.  Numbness of your finger.  Exposed bones (compound fracture) if the fracture is severe. DIAGNOSIS  The best way to diagnose a broken bone is with X-ray imaging. Additionally, your health care provider will use this X-ray image to evaluate the position of the broken finger bones.  TREATMENT  Finger fractures can be treated with:   Nonreduction--This means the bones are in place. The finger is splinted without changing the positions of the bone pieces. The splint is usually left on for about a week to 10 days. This will depend on your fracture and what your health care provider thinks.  Closed reduction--The bones are put back into position without using surgery. The finger is then splinted.  Open reduction and internal fixation--The fracture site is opened. Then the bone pieces are fixed into place with pins or some type of hardware. This is seldom required. It depends on the severity of the fracture. HOME CARE INSTRUCTIONS   Follow your health care provider's instructions regarding activities, exercises, and physical therapy.  Only take over-the-counter or prescription  medicines for pain, discomfort, or fever as directed by your health care provider. SEEK MEDICAL CARE IF: You have pain or swelling that limits the motion or use of your fingers. SEEK IMMEDIATE MEDICAL CARE IF:  Your finger becomes numb. MAKE SURE YOU:   Understand these instructions.  Will watch your condition.  Will get help right away if you are not doing well or get worse. Document Released: 10/03/2000 Document Revised: 04/11/2013 Document Reviewed: 01/31/2013 [facility] Patient Information 2015 [place], [place]. This information is not intended to replace advice given to you by your health care provider. Make sure you discuss any questions you have with your health care provider.  Cast or Splint Care Casts and splints support injured limbs and keep bones from moving while they heal.  HOME CARE  Keep the cast or splint uncovered during the drying period.  A plaster cast can take 24 to 48 hours to dry.  A fiberglass cast will dry in less than 1 hour.  Do not rest the cast on anything harder than a pillow for 24 hours.  Do not put weight on your injured limb. Do not put pressure on the cast. Wait for your doctor's approval.  Keep the cast or splint dry.  Cover the cast or splint with a plastic bag during baths or wet weather.  If you have a cast over your chest and belly (trunk), take sponge baths until the cast is taken off.  If your cast gets wet, dry it with a towel or blow dryer. Use the cool setting on the blow dryer.  Keep your cast or splint clean. Wash a dirty cast with a damp  cloth.  Do not put any objects under your cast or splint.  Do not scratch the skin under the cast with an object. If itching is a problem, use a blow dryer on a cool setting over the itchy area.  Do not trim or cut your cast.  Do not take out the padding from inside your cast.  Exercise your joints near the cast as told by your doctor.  Raise (elevate) your injured limb on 1 or 2 pillows  for the first 1 to 3 days. GET HELP IF:  Your cast or splint cracks.  Your cast or splint is too tight or too loose.  You itch badly under the cast.  Your cast gets wet or has a soft spot.  You have a bad smell coming from the cast.  You get an object stuck under the cast.  Your skin around the cast becomes red or sore.  You have new or more pain after the cast is put on. GET HELP RIGHT AWAY IF:  You have fluid leaking through the cast.  You cannot move your fingers or toes.  Your fingers or toes turn blue or white or are cool, painful, or puffy (swollen).  You have tingling or lose feeling (numbness) around the injured area.  You have bad pain or pressure under the cast.  You have trouble breathing or have shortness of breath.  You have chest pain. Document Released: 10/21/2010 Document Revised: 02/21/2013 Document Reviewed: 12/28/2012 [facility] Patient Information 2015 [place], [place]. This information is not intended to replace advice given to you by your health care provider. Make sure you discuss any questions you have with your health care provider.

## 2014-07-07 NOTE — ED Notes (Signed)
Family concerned about pain , swelling, ecchymosis of right middle finger. Onset 1-1, getting worse. Patient hist of dementia, cannot recall an injury

## 2014-07-11 DIAGNOSIS — S62629A Displaced fracture of medial phalanx of unspecified finger, initial encounter for closed fracture: Secondary | ICD-10-CM | POA: Diagnosis not present

## 2014-08-20 DIAGNOSIS — S62629D Displaced fracture of medial phalanx of unspecified finger, subsequent encounter for fracture with routine healing: Secondary | ICD-10-CM | POA: Diagnosis not present

## 2014-08-27 DIAGNOSIS — M6281 Muscle weakness (generalized): Secondary | ICD-10-CM | POA: Diagnosis not present

## 2014-08-27 DIAGNOSIS — M25641 Stiffness of right hand, not elsewhere classified: Secondary | ICD-10-CM | POA: Diagnosis not present

## 2014-08-27 DIAGNOSIS — M79641 Pain in right hand: Secondary | ICD-10-CM | POA: Diagnosis not present

## 2014-08-27 DIAGNOSIS — M25441 Effusion, right hand: Secondary | ICD-10-CM | POA: Diagnosis not present

## 2014-09-03 DIAGNOSIS — M6281 Muscle weakness (generalized): Secondary | ICD-10-CM | POA: Diagnosis not present

## 2014-09-03 DIAGNOSIS — M25441 Effusion, right hand: Secondary | ICD-10-CM | POA: Diagnosis not present

## 2014-09-03 DIAGNOSIS — M79641 Pain in right hand: Secondary | ICD-10-CM | POA: Diagnosis not present

## 2014-09-03 DIAGNOSIS — M25641 Stiffness of right hand, not elsewhere classified: Secondary | ICD-10-CM | POA: Diagnosis not present

## 2014-09-06 ENCOUNTER — Encounter: Payer: Self-pay | Admitting: Internal Medicine

## 2014-09-06 ENCOUNTER — Ambulatory Visit (INDEPENDENT_AMBULATORY_CARE_PROVIDER_SITE_OTHER): Payer: Medicare Other | Admitting: Internal Medicine

## 2014-09-06 VITALS — BP 144/72 | HR 66 | Temp 97.3°F | Wt 110.0 lb

## 2014-09-06 DIAGNOSIS — L02235 Carbuncle of perineum: Secondary | ICD-10-CM

## 2014-09-06 MED ORDER — CEPHALEXIN 250 MG PO CAPS: 250.0000 mg | ORAL_CAPSULE | Freq: Four times a day (QID) | ORAL | Status: DC

## 2014-09-06 NOTE — Patient Instructions (Signed)
Take Keflex 250 mg 4 times daily for 7 days.

## 2014-09-06 NOTE — Progress Notes (Signed)
   Subjective:    Patient ID: Dominique Frank, female    DOB: 1913/12/04, 81 y.o.   MRN: [id number]  HPI  Has left labial carbuncle that is partially drained. In today for evaluation. Apparently was a hard red bump earlier in the week. No fever or chills. Patient has dementia. Brought in by caretaker.    Review of Systems     Objective:   Physical Exam  Dime-sized partially draining carbuncle left labia majora      Assessment & Plan:  Carbuncle left labia majora  Plan: Keflex 250 mg 4 times daily for 7 days.

## 2014-09-17 DIAGNOSIS — M25641 Stiffness of right hand, not elsewhere classified: Secondary | ICD-10-CM | POA: Diagnosis not present

## 2014-09-17 DIAGNOSIS — M25441 Effusion, right hand: Secondary | ICD-10-CM | POA: Diagnosis not present

## 2014-09-17 DIAGNOSIS — M6281 Muscle weakness (generalized): Secondary | ICD-10-CM | POA: Diagnosis not present

## 2014-09-17 DIAGNOSIS — M79641 Pain in right hand: Secondary | ICD-10-CM | POA: Diagnosis not present

## 2014-09-24 DIAGNOSIS — M25641 Stiffness of right hand, not elsewhere classified: Secondary | ICD-10-CM | POA: Diagnosis not present

## 2014-09-24 DIAGNOSIS — M79641 Pain in right hand: Secondary | ICD-10-CM | POA: Diagnosis not present

## 2014-09-24 DIAGNOSIS — M6281 Muscle weakness (generalized): Secondary | ICD-10-CM | POA: Diagnosis not present

## 2014-09-24 DIAGNOSIS — M25441 Effusion, right hand: Secondary | ICD-10-CM | POA: Diagnosis not present

## 2014-10-01 DIAGNOSIS — M25441 Effusion, right hand: Secondary | ICD-10-CM | POA: Diagnosis not present

## 2014-10-01 DIAGNOSIS — M6281 Muscle weakness (generalized): Secondary | ICD-10-CM | POA: Diagnosis not present

## 2014-10-01 DIAGNOSIS — M25641 Stiffness of right hand, not elsewhere classified: Secondary | ICD-10-CM | POA: Diagnosis not present

## 2014-10-01 DIAGNOSIS — M79641 Pain in right hand: Secondary | ICD-10-CM | POA: Diagnosis not present

## 2014-11-06 DIAGNOSIS — H4011X3 Primary open-angle glaucoma, severe stage: Secondary | ICD-10-CM | POA: Diagnosis not present

## 2014-11-11 ENCOUNTER — Other Ambulatory Visit: Payer: Self-pay | Admitting: Internal Medicine

## 2014-11-11 ENCOUNTER — Encounter: Payer: Self-pay | Admitting: Internal Medicine

## 2014-11-11 ENCOUNTER — Ambulatory Visit
Admission: RE | Admit: 2014-11-11 | Discharge: 2014-11-11 | Disposition: A | Discharge status: Home / Self Care | Payer: Medicare Other | Source: Ambulatory Visit | Attending: Internal Medicine | Admitting: Internal Medicine

## 2014-11-11 ENCOUNTER — Telehealth: Payer: Self-pay | Admitting: Internal Medicine

## 2014-11-11 ENCOUNTER — Ambulatory Visit (INDEPENDENT_AMBULATORY_CARE_PROVIDER_SITE_OTHER): Payer: Medicare Other | Admitting: Internal Medicine

## 2014-11-11 VITALS — BP 136/84 | HR 61 | Temp 97.4°F

## 2014-11-11 DIAGNOSIS — R413 Other amnesia: Secondary | ICD-10-CM

## 2014-11-11 DIAGNOSIS — R5383 Other fatigue: Secondary | ICD-10-CM | POA: Diagnosis not present

## 2014-11-11 DIAGNOSIS — M545 Low back pain, unspecified: Secondary | ICD-10-CM

## 2014-11-11 DIAGNOSIS — M4855XA Collapsed vertebra, not elsewhere classified, thoracolumbar region, initial encounter for fracture: Secondary | ICD-10-CM | POA: Diagnosis not present

## 2014-11-11 NOTE — Patient Instructions (Signed)
Take Norco 5/325 sparingly for back pain. Labs are pending.

## 2014-11-11 NOTE — Telephone Encounter (Signed)
Needs appt and LS spine films. Caretaker says issues ambulating since last week.

## 2014-11-11 NOTE — Progress Notes (Signed)
   Subjective:    Patient ID: Dominique Frank, female    DOB: [birth year], 81 y.o.   MRN: [id number]  HPI  Her caretaker called today and said that she had not been ambulating well since the middle of last week. Complains of some back pain. They've been giving her Tylenol about 3 times a day. She has a history of osteoporosis and multiple compression fractures in the remote past. They don't know of any recent fall. She has dementia. She is ambulating slowly and grunting at times. She does not appear to feel well today. She's not  as friendly and smiling as she usually is. Seems to have difficulty cooperating. Holding head in her hand and not talking much. Difficult to get concise history. Family member not with her today.    Review of Systems     Objective:   Physical Exam  She is tender over her lower lumbar spine and to the right. X-ray shows no acute compression fracture just many old compression fractures. Lab work is pending. Disoriented to day of week, year, month.      Assessment & Plan:   Apparent acute low back pain  Plan: Norco 5/325 1/2-1 tablet by mouth every 8 hours when necessary pain #30 no refill. Reviewed lab work tomorrow. May need MRI of LS-spine.

## 2014-11-12 LAB — CBC WITH DIFFERENTIAL/PLATELET
BASOS ABS: 0 10*3/uL (ref 0.0–0.1)
BASOS PCT: 0 % (ref 0–1)
EOS PCT: 2 % (ref 0–5)
Eosinophils Absolute: 0.2 10*3/uL (ref 0.0–0.7)
HEMATOCRIT: 38.1 % (ref 36.0–46.0)
Hemoglobin: 12.9 g/dL (ref 12.0–15.0)
Lymphocytes Relative: 22 % (ref 12–46)
Lymphs Abs: 1.7 10*3/uL (ref 0.7–4.0)
MCH: 30.5 pg (ref 26.0–34.0)
MCHC: 33.9 g/dL (ref 30.0–36.0)
MCV: 90.1 fL (ref 78.0–100.0)
MPV: 10 fL (ref 8.6–12.4)
Monocytes Absolute: 0.6 10*3/uL (ref 0.1–1.0)
Monocytes Relative: 8 % (ref 3–12)
Neutro Abs: 5.3 10*3/uL (ref 1.7–7.7)
Neutrophils Relative %: 68 % (ref 43–77)
PLATELETS: 319 10*3/uL (ref 150–400)
RBC: 4.23 MIL/uL (ref 3.87–5.11)
RDW: 13.8 % (ref 11.5–15.5)
WBC: 7.8 10*3/uL (ref 4.0–10.5)

## 2014-11-12 LAB — COMPREHENSIVE METABOLIC PANEL
ALBUMIN: 3.5 g/dL (ref 3.5–5.2)
ALK PHOS: 86 U/L (ref 39–117)
ALT: 8 U/L (ref 0–35)
AST: 22 U/L (ref 0–37)
BUN: 14 mg/dL (ref 6–23)
CO2: 20 mEq/L (ref 19–32)
Calcium: 9.3 mg/dL (ref 8.4–10.5)
Chloride: 101 mEq/L (ref 96–112)
Creat: 0.74 mg/dL (ref 0.50–1.10)
Glucose, Bld: 83 mg/dL (ref 70–99)
Potassium: 5.4 mEq/L — ABNORMAL HIGH (ref 3.5–5.3)
SODIUM: 134 meq/L — AB (ref 135–145)
Total Bilirubin: 0.3 mg/dL (ref 0.2–1.2)
Total Protein: 6.7 g/dL (ref 6.0–8.3)

## 2015-01-29 ENCOUNTER — Other Ambulatory Visit: Payer: Self-pay | Admitting: Internal Medicine

## 2015-03-19 ENCOUNTER — Other Ambulatory Visit: Payer: Self-pay | Admitting: Internal Medicine

## 2015-05-14 DIAGNOSIS — H353130 Nonexudative age-related macular degeneration, bilateral, stage unspecified: Secondary | ICD-10-CM | POA: Diagnosis not present

## 2015-05-14 DIAGNOSIS — H401132 Primary open-angle glaucoma, bilateral, moderate stage: Secondary | ICD-10-CM | POA: Diagnosis not present

## 2015-06-25 DIAGNOSIS — H401132 Primary open-angle glaucoma, bilateral, moderate stage: Secondary | ICD-10-CM | POA: Diagnosis not present

## 2015-07-03 ENCOUNTER — Encounter: Payer: Self-pay | Admitting: Internal Medicine

## 2015-07-03 ENCOUNTER — Ambulatory Visit
Admission: RE | Admit: 2015-07-03 | Discharge: 2015-07-03 | Disposition: A | Discharge status: Home / Self Care | Payer: Medicare Other | Source: Ambulatory Visit | Attending: Internal Medicine | Admitting: Internal Medicine

## 2015-07-03 ENCOUNTER — Ambulatory Visit (INDEPENDENT_AMBULATORY_CARE_PROVIDER_SITE_OTHER): Payer: Medicare Other | Admitting: Internal Medicine

## 2015-07-03 VITALS — BP 146/88 | HR 72 | Temp 98.2°F | Resp 18 | Wt 109.0 lb

## 2015-07-03 DIAGNOSIS — R1030 Lower abdominal pain, unspecified: Secondary | ICD-10-CM

## 2015-07-03 DIAGNOSIS — H6122 Impacted cerumen, left ear: Secondary | ICD-10-CM | POA: Diagnosis not present

## 2015-07-03 DIAGNOSIS — J069 Acute upper respiratory infection, unspecified: Secondary | ICD-10-CM | POA: Diagnosis not present

## 2015-07-03 DIAGNOSIS — K5900 Constipation, unspecified: Secondary | ICD-10-CM | POA: Diagnosis not present

## 2015-07-03 DIAGNOSIS — K5641 Fecal impaction: Secondary | ICD-10-CM | POA: Diagnosis not present

## 2015-07-03 MED ORDER — AZITHROMYCIN 250 MG PO TABS: ORAL_TABLET | ORAL | Status: DC

## 2015-07-03 NOTE — Patient Instructions (Addendum)
Give one half bottle of magnesium citrate over ice tonight. Continue MiraLAX. Zithromax for acute URI.

## 2015-07-04 NOTE — Progress Notes (Signed)
   Subjective:    Patient ID: Dominique Frank, female    DOB: [birth year], 81 y.o.   MRN: [id number]  HPI 81 year old White Female with dementia brought in today by caretaker because of constipation issues. Apparently has been passing small balls of stool recently. They do have her on MiraLAX daily and apparently she's been taking it. She has had constipation issues from time to time. Also has been coughing. No fever. Caretaker once ears checked for impacted cerumen. Patient is always hard of hearing.    Review of Systems     Objective:   Physical Exam She has impacted cerumen left ear which was removed with curet. Her abdomen is distended. There is a lot of soft stool in the rectal vault. KUB is consistent with constipation. Right TM is clear. Pharynx is clear. Neck is supple without adenopathy or JVD. Chest is clear to auscultation. She does have a cough.       Assessment & Plan:  Constipation-was given a Fleet's enema here in the office with good result. Asked caretaker to give her one half bottle of magnesium citrate over ice later today.  Impacted cerumen left ear-removed with curet  Acute URI-treat with Zithromax Z-PAK 2 tablets by mouth day 1 followed by 1 by mouth days 2 through 5  45 minutes spent with patient including time administering enema

## 2015-07-08 ENCOUNTER — Ambulatory Visit (INDEPENDENT_AMBULATORY_CARE_PROVIDER_SITE_OTHER): Payer: Medicare Other | Admitting: Internal Medicine

## 2015-07-08 ENCOUNTER — Encounter: Payer: Self-pay | Admitting: Internal Medicine

## 2015-07-08 VITALS — BP 120/70 | HR 69 | Temp 98.3°F | Wt 108.0 lb

## 2015-07-08 DIAGNOSIS — J069 Acute upper respiratory infection, unspecified: Secondary | ICD-10-CM

## 2015-07-08 MED ORDER — AMOXICILLIN 500 MG PO CAPS: 500.0000 mg | ORAL_CAPSULE | Freq: Three times a day (TID) | ORAL | Status: DC

## 2015-07-08 MED ORDER — CEFTRIAXONE SODIUM 1 G IJ SOLR
1.0000 g | Freq: Once | INTRAMUSCULAR | Status: AC
  Administered 2015-07-08: 1 g via INTRAMUSCULAR

## 2015-07-08 MED ORDER — BENZONATATE 100 MG PO CAPS: 100.0000 mg | ORAL_CAPSULE | Freq: Three times a day (TID) | ORAL | Status: DC

## 2015-07-08 NOTE — Progress Notes (Signed)
   Subjective:    Patient ID: Dominique Frank, female    DOB: [birth year], 82 y.o.   MRN: [id number]  HPI 82 year old Female was just here December 29 with constipation and URI symptoms. Was prescribed a Zithromax Z-Pak and cerumen was removed from left external ear canal. Caretakers called this morning and said she had a  Horrible cold and cough. They're unable to give a clear history. Patient has dementia and she is not able to give a very good history either.    Review of Systems     Objective:   Physical Exam  Skin warm and dry. Nodes none. TMs are clear bilaterally. Neck is supple. Chest clear to auscultation without rales or wheezing.      Assessment & Plan:  Protracted URI  Plan: She has finished Zithromax Z-Pak. Prescribed amoxicillin 500 mg 3 times daily for 10 days. Tessalon Perles 100 mg 3 times daily as needed for cough. Rest and drink plenty of fluids.

## 2015-07-08 NOTE — Patient Instructions (Signed)
Your chest is clear. Take Tessalon Perles 100 mg 3 times daily as needed for cough. Rest and drink plenty of fluids. Amoxicillin 500 mg 3 times daily for 10 days.

## 2015-09-05 ENCOUNTER — Telehealth: Payer: Self-pay | Admitting: Internal Medicine

## 2015-09-05 ENCOUNTER — Ambulatory Visit
Admission: RE | Admit: 2015-09-05 | Discharge: 2015-09-05 | Disposition: A | Discharge status: Home / Self Care | Payer: Medicare Other | Source: Ambulatory Visit | Attending: Internal Medicine | Admitting: Internal Medicine

## 2015-09-05 ENCOUNTER — Ambulatory Visit (INDEPENDENT_AMBULATORY_CARE_PROVIDER_SITE_OTHER): Payer: Medicare Other | Admitting: Internal Medicine

## 2015-09-05 ENCOUNTER — Encounter: Payer: Self-pay | Admitting: Internal Medicine

## 2015-09-05 VITALS — BP 148/90 | HR 84 | Temp 98.5°F | Resp 20 | Wt 107.0 lb

## 2015-09-05 DIAGNOSIS — R1031 Right lower quadrant pain: Secondary | ICD-10-CM | POA: Diagnosis not present

## 2015-09-05 DIAGNOSIS — R140 Abdominal distension (gaseous): Secondary | ICD-10-CM

## 2015-09-05 DIAGNOSIS — K5900 Constipation, unspecified: Secondary | ICD-10-CM

## 2015-09-05 LAB — CBC WITH DIFFERENTIAL/PLATELET
Basophils Absolute: 0 10*3/uL (ref 0.0–0.1)
Basophils Relative: 0 % (ref 0–1)
Eosinophils Absolute: 0.1 10*3/uL (ref 0.0–0.7)
Eosinophils Relative: 1 % (ref 0–5)
HCT: 40.1 % (ref 36.0–46.0)
HEMOGLOBIN: 13.4 g/dL (ref 12.0–15.0)
LYMPHS PCT: 23 % (ref 12–46)
Lymphs Abs: 1.3 10*3/uL (ref 0.7–4.0)
MCH: 30.2 pg (ref 26.0–34.0)
MCHC: 33.4 g/dL (ref 30.0–36.0)
MCV: 90.3 fL (ref 78.0–100.0)
MONO ABS: 0.5 10*3/uL (ref 0.1–1.0)
MPV: 9.6 fL (ref 8.6–12.4)
Monocytes Relative: 8 % (ref 3–12)
NEUTROS ABS: 3.9 10*3/uL (ref 1.7–7.7)
Neutrophils Relative %: 68 % (ref 43–77)
Platelets: 306 10*3/uL (ref 150–400)
RBC: 4.44 MIL/uL (ref 3.87–5.11)
RDW: 13.8 % (ref 11.5–15.5)
WBC: 5.7 10*3/uL (ref 4.0–10.5)

## 2015-09-05 NOTE — Progress Notes (Signed)
   Subjective:    Patient ID: Dominique Frank, female    DOB: [birth year], 82 y.o.   MRN: [id number]  HPI 82 year old White Female with history of recurrent constipation. Has to be disimpacted from time to time. Accompanied today by caretaker. Apparently no significant bowel movement for 2 weeks. May have small finger size stool but not regularly. Caretaker is noted abdominal distention. No vomiting. Has been crying and complaining of pain.    Review of Systems     Objective:   Physical Exam  There is no stool in the rectal vault to disimpact. Abdomen is tympanic and bloated with tenderness that is diffuse. Sick for KUB flat and upright. CBC with differential pending.      Assessment & Plan:  Abdominal distention  Constipation  Plan: KUB flat and upright to rule out small bowel obstruction. CBC with differential pending.  Addendum: CBC with differential is normal. X-ray shows no evidence of small bowel obstruction. Family is to obtain magnesium citrate and have patient drink an entire bottle over ice today and if no result in 24 hours repeat tomorrow.

## 2015-09-05 NOTE — Patient Instructions (Addendum)
Sent for KUB flat and upright. CBC with differential pending. Try magnesium citrate over eyes one bottle today and if no result in 24 hours repeat tomorrow. X-ray shows no evidence of small bowel obstruction. CBC is normal.

## 2015-09-05 NOTE — Telephone Encounter (Signed)
States patient seems to be constipated again.  She has given her a fleets enema and she had very little result from that.  States that patient takes stool softeners daily.  States patient is not taking drinking or eating very much.  She feels this is due to her feeling "full".  States that she is having some crying spells because she feels that she's hurting because she is "bound up" again.  She has bought prune juice but the patient will not drink enough to make a difference or seem to help her.  Patient has spoken to her family and ask them to call here, but they talk to patient and beg her to eat.  States patient doesn't want to eat because she feels "full" already.    What else can she do to aide patient in any relief efforts?

## 2015-09-05 NOTE — Telephone Encounter (Signed)
See to check for impaction

## 2015-09-08 ENCOUNTER — Telehealth: Payer: Self-pay | Admitting: Internal Medicine

## 2015-09-08 ENCOUNTER — Ambulatory Visit: Payer: Medicare Other | Admitting: Internal Medicine

## 2015-09-08 NOTE — Telephone Encounter (Signed)
LM for Pallative Care at [phone] for return call

## 2015-09-08 NOTE — Telephone Encounter (Signed)
Spoke with son, Cheyney Veleta, [place] [phone] says mother had good BM late yesterday and is eating despite what caretaker says. Wants nurse to check on pt once a week. Maybe Palliative Care could consult.

## 2015-09-08 NOTE — Telephone Encounter (Signed)
Spoke with Jari Sportsman and advised that patient will need to go to the ED to be evaluated.  She advised that one of the sons may call back.

## 2015-09-08 NOTE — Telephone Encounter (Signed)
Son, Dominique Frank is calling back.  States that they would like to know if they can get a home health nurse set up to come out and check on the patient at least once a week.  They feel that she is at a point where they need some sort of medical personnel in there laying eyes on her at least once a week.  Son states that they feel someone needs to see her in her environment and evaluate the environment.  He seemed to feel that she was adequately relieved from her bowels with the magnesium citrate over the weekend.  He said that they are willing to do anything that they need to do including taking her to the hospital if she indeed needs to go.  However, she is 37 and they know that she couldn't withstand a surgery if she needed one.  He said he wants to be sensible in what they do and wants to provide her with quality as much as he possibly can.    Can we start home health care for them?  Or, is this a good candidate to refer to [facility]??

## 2015-09-08 NOTE — Telephone Encounter (Signed)
States that they gave patient the magnesium citrate Friday and she went to the bathroom a "little bit", she repeated it on Saturday morning and she vomited and went to the bathroom "a little bit".  She doesn't know what happened on Sunday because she wasn't there and nothing was written down.  I asked her to find out.  She states that the patient is complaining that her stomach is hurting and she cannot go to the bathroom again this morning.  She wants to know what they should do today?  Advised that her blood work was normal and the x-ray didn't show a bowel obstruction.    Please advise how to proceed from here.  She states patient is whining and complaining again this morning that she's hurting and she's unsure what to do to help her.

## 2015-09-08 NOTE — Telephone Encounter (Signed)
Take pt to ED to be evaluated

## 2015-09-10 DIAGNOSIS — R531 Weakness: Secondary | ICD-10-CM | POA: Diagnosis not present

## 2015-09-11 ENCOUNTER — Telehealth: Payer: Self-pay

## 2015-09-11 NOTE — Telephone Encounter (Signed)
Dominique Frank with pallative care contacted office and states that he seen Senaya in her home yesterday late afternoon. Jari Sportsman is concerned that she may have a UTI due to suprapubic pain that Merrily Pew thinks is related to the constipation. Patient is not complaining of any pain or discomfort. He states that he is following up with Harmon Pier on Monday. Please advise if you would like for me to reach out to the patient and Jari Sportsman and if so, what to advise them.

## 2015-09-11 NOTE — Telephone Encounter (Signed)
Attempted to contact patient and annie mae but line is busy. Will try again

## 2015-09-11 NOTE — Telephone Encounter (Signed)
I suppose Dominique Frank could come get urine specimen cup and wipes and bring specimen back within 2 hours tomorrow.

## 2015-09-16 DIAGNOSIS — R531 Weakness: Secondary | ICD-10-CM | POA: Diagnosis not present

## 2015-09-16 NOTE — Telephone Encounter (Signed)
Received a request from Ernest Pine, social worker at [employer] here in [place] requesting information on [employer]. Medical records faxed. Spoke with Ms. Joya Gaskins by phone who indicated a complaint had been made anonymously. Investigation is in process.

## 2015-09-17 ENCOUNTER — Telehealth: Payer: Self-pay

## 2015-09-17 ENCOUNTER — Inpatient Hospital Stay (HOSPITAL_COMMUNITY)
Admission: EM | Admit: 2015-09-17 | Discharge: 2015-09-20 | DRG: 750 | Disposition: A | Discharge status: Hospice | Payer: Medicare Other | Attending: Internal Medicine | Admitting: Internal Medicine

## 2015-09-17 ENCOUNTER — Emergency Department (HOSPITAL_COMMUNITY): Payer: Medicare Other

## 2015-09-17 ENCOUNTER — Encounter (HOSPITAL_COMMUNITY): Payer: Self-pay | Admitting: Family Medicine

## 2015-09-17 DIAGNOSIS — F419 Anxiety disorder, unspecified: Secondary | ICD-10-CM | POA: Diagnosis present

## 2015-09-17 DIAGNOSIS — Z66 Do not resuscitate: Secondary | ICD-10-CM | POA: Diagnosis present

## 2015-09-17 DIAGNOSIS — M8580 Other specified disorders of bone density and structure, unspecified site: Secondary | ICD-10-CM | POA: Diagnosis not present

## 2015-09-17 DIAGNOSIS — K5660 Unspecified intestinal obstruction: Secondary | ICD-10-CM | POA: Diagnosis not present

## 2015-09-17 DIAGNOSIS — E785 Hyperlipidemia, unspecified: Secondary | ICD-10-CM | POA: Diagnosis present

## 2015-09-17 DIAGNOSIS — Z7189 Other specified counseling: Secondary | ICD-10-CM | POA: Insufficient documentation

## 2015-09-17 DIAGNOSIS — R229 Localized swelling, mass and lump, unspecified: Secondary | ICD-10-CM | POA: Diagnosis not present

## 2015-09-17 DIAGNOSIS — Z7982 Long term (current) use of aspirin: Secondary | ICD-10-CM

## 2015-09-17 DIAGNOSIS — N135 Crossing vessel and stricture of ureter without hydronephrosis: Secondary | ICD-10-CM | POA: Diagnosis not present

## 2015-09-17 DIAGNOSIS — D649 Anemia, unspecified: Secondary | ICD-10-CM | POA: Diagnosis not present

## 2015-09-17 DIAGNOSIS — K5900 Constipation, unspecified: Secondary | ICD-10-CM | POA: Diagnosis present

## 2015-09-17 DIAGNOSIS — R109 Unspecified abdominal pain: Secondary | ICD-10-CM | POA: Diagnosis not present

## 2015-09-17 DIAGNOSIS — N281 Cyst of kidney, acquired: Secondary | ICD-10-CM | POA: Diagnosis not present

## 2015-09-17 DIAGNOSIS — I10 Essential (primary) hypertension: Secondary | ICD-10-CM | POA: Diagnosis not present

## 2015-09-17 DIAGNOSIS — R1900 Intra-abdominal and pelvic swelling, mass and lump, unspecified site: Secondary | ICD-10-CM | POA: Diagnosis present

## 2015-09-17 DIAGNOSIS — F0390 Unspecified dementia without behavioral disturbance: Secondary | ICD-10-CM | POA: Diagnosis present

## 2015-09-17 DIAGNOSIS — N83202 Unspecified ovarian cyst, left side: Secondary | ICD-10-CM | POA: Diagnosis not present

## 2015-09-17 DIAGNOSIS — K56609 Unspecified intestinal obstruction, unspecified as to partial versus complete obstruction: Secondary | ICD-10-CM | POA: Insufficient documentation

## 2015-09-17 DIAGNOSIS — N83209 Unspecified ovarian cyst, unspecified side: Secondary | ICD-10-CM

## 2015-09-17 DIAGNOSIS — N839 Noninflammatory disorder of ovary, fallopian tube and broad ligament, unspecified: Secondary | ICD-10-CM | POA: Diagnosis not present

## 2015-09-17 DIAGNOSIS — Z515 Encounter for palliative care: Secondary | ICD-10-CM | POA: Insufficient documentation

## 2015-09-17 DIAGNOSIS — R1084 Generalized abdominal pain: Secondary | ICD-10-CM | POA: Diagnosis not present

## 2015-09-17 DIAGNOSIS — N838 Other noninflammatory disorders of ovary, fallopian tube and broad ligament: Secondary | ICD-10-CM

## 2015-09-17 DIAGNOSIS — H409 Unspecified glaucoma: Secondary | ICD-10-CM | POA: Diagnosis present

## 2015-09-17 DIAGNOSIS — K449 Diaphragmatic hernia without obstruction or gangrene: Secondary | ICD-10-CM | POA: Diagnosis not present

## 2015-09-17 DIAGNOSIS — Z23 Encounter for immunization: Secondary | ICD-10-CM | POA: Diagnosis present

## 2015-09-17 LAB — COMPREHENSIVE METABOLIC PANEL
ALBUMIN: 3.2 g/dL — AB (ref 3.5–5.0)
ALT: 9 U/L — AB (ref 14–54)
AST: 23 U/L (ref 15–41)
Alkaline Phosphatase: 93 U/L (ref 38–126)
Anion gap: 13 (ref 5–15)
BUN: 12 mg/dL (ref 6–20)
CHLORIDE: 101 mmol/L (ref 101–111)
CO2: 24 mmol/L (ref 22–32)
CREATININE: 0.71 mg/dL (ref 0.44–1.00)
Calcium: 9.2 mg/dL (ref 8.9–10.3)
GFR calc Af Amer: >60 mL/min (ref >60)
GFR calc non Af Amer: >60 mL/min (ref >60)
Glucose, Bld: 108 mg/dL — ABNORMAL HIGH (ref 65–99)
Potassium: 3.8 mmol/L (ref 3.5–5.1)
SODIUM: 138 mmol/L (ref 135–145)
Total Bilirubin: 0.7 mg/dL (ref 0.3–1.2)
Total Protein: 6.4 g/dL — ABNORMAL LOW (ref 6.5–8.1)

## 2015-09-17 LAB — URINE MICROSCOPIC-ADD ON: WBC, UA: NONE SEEN WBC/hpf (ref 0–5)

## 2015-09-17 LAB — CBC
HCT: 39.6 % (ref 36.0–46.0)
Hemoglobin: 12.7 g/dL (ref 12.0–15.0)
MCH: 29.1 pg (ref 26.0–34.0)
MCHC: 32.1 g/dL (ref 30.0–36.0)
MCV: 90.6 fL (ref 78.0–100.0)
Platelets: 331 10*3/uL (ref 150–400)
RBC: 4.37 MIL/uL (ref 3.87–5.11)
RDW: 13.8 % (ref 11.5–15.5)
WBC: 8.5 10*3/uL (ref 4.0–10.5)

## 2015-09-17 LAB — URINALYSIS, ROUTINE W REFLEX MICROSCOPIC
Bilirubin Urine: NEGATIVE
GLUCOSE, UA: NEGATIVE mg/dL
Ketones, ur: 15 mg/dL — AB
Leukocytes, UA: NEGATIVE
Nitrite: NEGATIVE
Protein, ur: NEGATIVE mg/dL
Specific Gravity, Urine: 1.005 (ref 1.005–1.030)
pH: 6 (ref 5.0–8.0)

## 2015-09-17 LAB — LIPASE, BLOOD: LIPASE: 31 U/L (ref 11–51)

## 2015-09-17 MED ORDER — IOHEXOL 300 MG/ML  SOLN
75.0000 mL | Freq: Once | INTRAMUSCULAR | Status: AC | PRN
  Administered 2015-09-17: 75 mL via INTRAVENOUS

## 2015-09-17 MED ORDER — ONDANSETRON HCL 4 MG/2ML IJ SOLN
INTRAMUSCULAR | Status: AC
  Filled 2015-09-17: qty 2

## 2015-09-17 MED ORDER — FENTANYL CITRATE (PF) 100 MCG/2ML IJ SOLN
50.0000 ug | Freq: Once | INTRAMUSCULAR | Status: AC
  Administered 2015-09-17: 25 ug via INTRAVENOUS

## 2015-09-17 MED ORDER — FENTANYL CITRATE (PF) 100 MCG/2ML IJ SOLN
50.0000 ug | Freq: Once | INTRAMUSCULAR | Status: AC
  Administered 2015-09-17: 50 ug via INTRAVENOUS
  Filled 2015-09-17: qty 2

## 2015-09-17 MED ORDER — ONDANSETRON HCL 4 MG/2ML IJ SOLN
4.0000 mg | Freq: Once | INTRAMUSCULAR | Status: AC | PRN
  Administered 2015-09-17: 4 mg via INTRAVENOUS

## 2015-09-17 MED ORDER — IOHEXOL 300 MG/ML  SOLN: 25.0000 mL | INTRAMUSCULAR | Status: AC

## 2015-09-17 MED ORDER — FENTANYL CITRATE (PF) 100 MCG/2ML IJ SOLN
INTRAMUSCULAR | Status: AC
  Filled 2015-09-17: qty 2

## 2015-09-17 NOTE — Telephone Encounter (Signed)
Patients son Leane Call contacted office stating that they gave Nicosha the magnesium citrate over ice about an hour ago. He states that [employer] reports brown stuff coming out of her mouth. She states that it is not vomit it is just brown stuff. Dr. Renold Genta advised me to tell Linwood to head to ED as it could be an obstruction/blockage. Shortly after notifying Gayla Medicus contacted office and she was also advised to take Ms. Pensinger to ED. Deneise Lever as well as Linwood verbalized understanding to transport Debbie to ED.

## 2015-09-17 NOTE — Telephone Encounter (Signed)
Dominique Frank here at office to pick up urine specimen container. Advised him how to obtain clean catch. Says home test was positive twice for UTI. Says very small BM twice in last couple of days. Will repeat Magnesium Citrate. Says abdomen is distended and she is not eating much.

## 2015-09-17 NOTE — ED Provider Notes (Signed)
CSN: [id number]     Arrival date & time 09/17/15  1726 History   First MD Initiated Contact with Patient 09/17/15 2008     Chief Complaint  Patient presents with  . Abdominal Pain  . Constipation  . Emesis     (Consider location/radiation/quality/duration/timing/severity/associated sxs/prior Treatment) Patient is a 82 y.o. female presenting with abdominal pain, constipation, and vomiting.  Abdominal Pain Pain location:  Generalized Pain quality: aching, bloating, fullness and sharp   Pain severity:  Mild Onset quality:  Gradual Duration:  3 days Timing:  Constant Progression:  Worsening Chronicity:  New Context: not alcohol use   Relieved by:  None tried Worsened by:  Nothing tried Ineffective treatments:  None tried Associated symptoms: constipation and vomiting   Associated symptoms: no anorexia, no chills, no cough, no fever, no hematuria and no shortness of breath   Constipation Associated symptoms: abdominal pain and vomiting   Associated symptoms: no anorexia and no fever   Emesis Associated symptoms: abdominal pain   Associated symptoms: no chills     Past Medical History  Diagnosis Date  . Hyperlipidemia   . Hypertension   . Osteopenia   . Constipation   . Anxiety   . Glaucoma   . Bowel obstruction [facility])    Past Surgical History  Procedure Laterality Date  . Abdominal hysterectomy    . Hernia repair    . Vag burch suspension    . Bowel obstruction      ? type   Family History  Problem Relation Age of Onset  . Colon cancer Neg Hx   . Stroke Mother    Social History  Substance Use Topics  . Smoking status: Never Smoker   . Smokeless tobacco: Never Used  . Alcohol Use: No   OB History    No data available     Review of Systems  Constitutional: Negative for fever and chills.  Eyes: Negative for photophobia and pain.  Respiratory: Negative for cough and shortness of breath.   Gastrointestinal: Positive for vomiting, abdominal pain and  constipation. Negative for anorexia.  Genitourinary: Negative for hematuria, flank pain and enuresis.  All other systems reviewed and are negative.     Allergies  Codeine  Home Medications   Prior to Admission medications   Medication Sig Start Date End Date Taking? Authorizing Provider  aspirin 81 MG EC tablet Take 81 mg by mouth daily.     Yes Historical Provider, MD  calcium carbonate (OS-CAL) 1250 MG chewable tablet Chew 1 tablet by mouth daily.     Yes Historical Provider, MD  COMBIGAN 0.2-0.5 % ophthalmic solution Place 1 drop into both eyes every 12 (twelve) hours.  11/18/10  Yes Historical Provider, MD  omeprazole (PRILOSEC) 20 MG capsule Take 20 mg by mouth daily.   Yes Historical Provider, MD  PARoxetine (PAXIL) 10 MG tablet TAKE 1 TABLET BY MOUTH DAILY Patient taking differently: TAKE 0.5 TABLET BY MOUTH DAILY 03/19/15  Yes Elby Showers, MD  TRAVATAN Z 0.004 % ophthalmic solution Place 1 drop into both eyes at bedtime.  11/18/10  Yes Historical Provider, MD  saccharomyces boulardii (FLORASTOR) 250 MG capsule Take 1 capsule (250 mg total) by mouth 2 (two) times daily. 08/16/13   Amy S Esterwood, PA-C   BP 157/84 mmHg  Pulse 80  Temp(Src) 98.5 F (36.9 C) (Oral)  Resp 16  Ht 5' (1.524 m)  Wt 109 lb (49.442 kg)  BMI 21.29 kg/m2  SpO2 93% Physical Exam  Constitutional: She appears well-developed and well-nourished.  HENT:  Head: Normocephalic and atraumatic.  Neck: Normal range of motion.  Cardiovascular: Normal rate and regular rhythm.   Pulmonary/Chest: No stridor. No respiratory distress.  Abdominal: She exhibits distension. There is tenderness. There is no rebound.  Neurological: She is alert.  Skin: Skin is warm and dry. No rash noted. No erythema.  Nursing note and vitals reviewed.   ED Course  Procedures (including critical care time) Labs Review Labs Reviewed  COMPREHENSIVE METABOLIC PANEL - Abnormal; Notable for the following:    Glucose, Bld 108 (*)     Total Protein 6.4 (*)    Albumin 3.2 (*)    ALT 9 (*)    All other components within normal limits  LIPASE, BLOOD  CBC  URINALYSIS, ROUTINE W REFLEX MICROSCOPIC (NOT AT [facility])    Imaging Review Ct Abdomen Pelvis W Contrast  09/17/2015  CLINICAL DATA:  Bloating and vomiting EXAM: CT ABDOMEN AND PELVIS WITH CONTRAST TECHNIQUE: Multidetector CT imaging of the abdomen and pelvis was performed using the standard protocol following bolus administration of intravenous contrast. CONTRAST:  76mL OMNIPAQUE IOHEXOL 300 MG/ML  SOLN COMPARISON:  None. FINDINGS: Lung bases demonstrate a small left-sided pleural effusion. No focal infiltrate is noted. Coronary calcifications are seen as well as a moderate-sized hiatal hernia. The liver and gallbladder as well as the spleen are within normal limits. The adrenal glands and pancreas are within normal limits. The kidneys show bilateral cystic change as well as fullness of the left collecting system. This is likely related to a an overly distended bladder as no definitive stone is seen. The bladder demonstrates some increased enhancing changes particularly on the left suspicious for underlying bladder mass. Further evaluation is recommended. The uterus has been surgically removed. Gaseous distension of the colon is noted as well as some fecal material throughout the colon. No true obstructive changes are seen although significant mass-effect upon the rectosigmoid is noted by the distended bladder. This may be causing a somewhat obstructive pattern. The osseous structures show healed pubic rami fractures as well as generalized osteopenia. Compression deformities are noted at T11 and L1 and to a lesser degree at L3. These all appear chronic in nature. IMPRESSION: Significantly over distended bladder with subsequent dilatation of the left collecting system. There are some enhancing areas within the left half of the bladder which are suspicious for underlying mass lesion. Further  evaluation is recommended. The bladder causes some mass-effect upon the rectosigmoid causing some gaseous distention and fecal material within the transverse colon and left colon. Moderate hiatal hernia. Renal cystic changes. Small left pleural effusion. Electronically Signed   By: Inez Catalina M.D.   On: 09/17/2015 21:51   I have personally reviewed and evaluated these images and lab results as part of my medical decision-making.   EKG Interpretation None      MDM   Final diagnoses:  Intestinal obstruction, unspecified type ([place])  Ovarian mass    82 yo F w/ bowel obstruction 2/2 ovarian mass (per radiology overread). No vomiting now, no NG. D/w Dr. Elonda Husky with gynecology who suggests admission for pain control and someone would see patient tomorrow for further recommendations. Goals of care discussion with the family and they want to hear all the options before making decisions about palliative care route. Discussed case with medicine who will admit for pain control and awaiting gynecology consult.    Merrily Pew, MD 09/18/15 [phone]

## 2015-09-17 NOTE — ED Notes (Signed)
Patient transported to CT 

## 2015-09-17 NOTE — ED Notes (Signed)
Pt here for abd discomfort and bloating. sts has been constipated and taking mag citrate and had some stool after. sts still bloated and the doctor instructed to try again and this time she vomited up mag citrate and dark, possibly stool. sts also tested positive for UTI.

## 2015-09-17 NOTE — Telephone Encounter (Signed)
Josh, the NP with pallative care contacted office and states that the sons are concerned that Sherril has a UTI.  Leane Call is in the home and can be reached at [phone]. I will notify son to come and pick up a specimen kit. Her last BM per family's report was on Sunday and it was a very small one. Merrily Pew states that he will go back out tomorrow afternoon if we feel it will help. If not, he states that he will be going out next week. Josh call back number is [phone]. Leane Call is coming to get the kit for UTI and will bring the specimen back with in 2 hours of being collected. Josh mentioned the sons asking about pain medication and he stated that they could certainly explore that possibility but that with her history of constipation he wouldn't recommend it for her as she is not in any physical pain at this time.

## 2015-09-17 NOTE — ED Notes (Signed)
Attempted to draw pts labs was unsuccessful notified RN.

## 2015-09-17 NOTE — Telephone Encounter (Signed)
Please call Josh and ask him to go back out. If she is not eating then will not have much BM. Could try Magnesium citrate again if really constipated. Last time abdomen was very distended and family said she had BM several days later. Tell Josh someone reported family to Social [employer] Adult [employer] Services so we need to be sure she is seen. Investigation is ongoing.

## 2015-09-18 DIAGNOSIS — H409 Unspecified glaucoma: Secondary | ICD-10-CM

## 2015-09-18 DIAGNOSIS — Z66 Do not resuscitate: Secondary | ICD-10-CM | POA: Diagnosis not present

## 2015-09-18 DIAGNOSIS — N135 Crossing vessel and stricture of ureter without hydronephrosis: Secondary | ICD-10-CM | POA: Diagnosis not present

## 2015-09-18 DIAGNOSIS — R1900 Intra-abdominal and pelvic swelling, mass and lump, unspecified site: Secondary | ICD-10-CM | POA: Diagnosis present

## 2015-09-18 DIAGNOSIS — K5660 Unspecified intestinal obstruction: Secondary | ICD-10-CM

## 2015-09-18 DIAGNOSIS — R1084 Generalized abdominal pain: Secondary | ICD-10-CM | POA: Diagnosis not present

## 2015-09-18 DIAGNOSIS — Z23 Encounter for immunization: Secondary | ICD-10-CM | POA: Diagnosis not present

## 2015-09-18 DIAGNOSIS — Z7982 Long term (current) use of aspirin: Secondary | ICD-10-CM | POA: Diagnosis not present

## 2015-09-18 DIAGNOSIS — E785 Hyperlipidemia, unspecified: Secondary | ICD-10-CM | POA: Diagnosis not present

## 2015-09-18 DIAGNOSIS — N839 Noninflammatory disorder of ovary, fallopian tube and broad ligament, unspecified: Secondary | ICD-10-CM | POA: Diagnosis present

## 2015-09-18 DIAGNOSIS — R1909 Other intra-abdominal and pelvic swelling, mass and lump: Secondary | ICD-10-CM | POA: Diagnosis not present

## 2015-09-18 DIAGNOSIS — R109 Unspecified abdominal pain: Secondary | ICD-10-CM | POA: Diagnosis not present

## 2015-09-18 DIAGNOSIS — Z515 Encounter for palliative care: Secondary | ICD-10-CM | POA: Diagnosis not present

## 2015-09-18 DIAGNOSIS — K5900 Constipation, unspecified: Secondary | ICD-10-CM | POA: Diagnosis not present

## 2015-09-18 DIAGNOSIS — F419 Anxiety disorder, unspecified: Secondary | ICD-10-CM | POA: Diagnosis not present

## 2015-09-18 DIAGNOSIS — N838 Other noninflammatory disorders of ovary, fallopian tube and broad ligament: Secondary | ICD-10-CM | POA: Insufficient documentation

## 2015-09-18 DIAGNOSIS — M8580 Other specified disorders of bone density and structure, unspecified site: Secondary | ICD-10-CM | POA: Diagnosis not present

## 2015-09-18 DIAGNOSIS — K56609 Unspecified intestinal obstruction, unspecified as to partial versus complete obstruction: Secondary | ICD-10-CM | POA: Insufficient documentation

## 2015-09-18 DIAGNOSIS — D649 Anemia, unspecified: Secondary | ICD-10-CM | POA: Diagnosis not present

## 2015-09-18 DIAGNOSIS — F0390 Unspecified dementia without behavioral disturbance: Secondary | ICD-10-CM | POA: Diagnosis not present

## 2015-09-18 DIAGNOSIS — N83202 Unspecified ovarian cyst, left side: Secondary | ICD-10-CM | POA: Diagnosis not present

## 2015-09-18 DIAGNOSIS — I10 Essential (primary) hypertension: Secondary | ICD-10-CM | POA: Diagnosis not present

## 2015-09-18 DIAGNOSIS — C763 Malignant neoplasm of pelvis: Secondary | ICD-10-CM | POA: Diagnosis not present

## 2015-09-18 LAB — BASIC METABOLIC PANEL
ANION GAP: 9 (ref 5–15)
BUN: 12 mg/dL (ref 6–20)
CHLORIDE: 102 mmol/L (ref 101–111)
CO2: 26 mmol/L (ref 22–32)
Calcium: 8.5 mg/dL — ABNORMAL LOW (ref 8.9–10.3)
Creatinine, Ser: 0.65 mg/dL (ref 0.44–1.00)
GFR calc non Af Amer: >60 mL/min (ref >60)
Glucose, Bld: 105 mg/dL — ABNORMAL HIGH (ref 65–99)
POTASSIUM: 3.9 mmol/L (ref 3.5–5.1)
SODIUM: 137 mmol/L (ref 135–145)

## 2015-09-18 LAB — CBC
HEMATOCRIT: 33.6 % — AB (ref 36.0–46.0)
HEMOGLOBIN: 11.1 g/dL — AB (ref 12.0–15.0)
MCH: 29.8 pg (ref 26.0–34.0)
MCHC: 33 g/dL (ref 30.0–36.0)
MCV: 90.1 fL (ref 78.0–100.0)
Platelets: 270 10*3/uL (ref 150–400)
RBC: 3.73 MIL/uL — AB (ref 3.87–5.11)
RDW: 13.8 % (ref 11.5–15.5)
WBC: 8.4 10*3/uL (ref 4.0–10.5)

## 2015-09-18 MED ORDER — LEVALBUTEROL HCL 0.63 MG/3ML IN NEBU
0.6300 mg | INHALATION_SOLUTION | Freq: Four times a day (QID) | RESPIRATORY_TRACT | Status: DC | PRN
Start: 2015-09-18 — End: 2015-09-20

## 2015-09-18 MED ORDER — KCL IN DEXTROSE-NACL 20-5-0.9 MEQ/L-%-% IV SOLN
INTRAVENOUS | Status: DC
  Administered 2015-09-18 – 2015-09-19 (×2): via INTRAVENOUS
  Filled 2015-09-18 (×4): qty 1000

## 2015-09-18 MED ORDER — CETYLPYRIDINIUM CHLORIDE 0.05 % MT LIQD
7.0000 mL | Freq: Two times a day (BID) | OROMUCOSAL | Status: DC
  Administered 2015-09-18 – 2015-09-19 (×3): 7 mL via OROMUCOSAL

## 2015-09-18 MED ORDER — TIMOLOL MALEATE 0.5 % OP SOLN: 1.0000 [drp] | Freq: Two times a day (BID) | OPHTHALMIC | Status: DC

## 2015-09-18 MED ORDER — ONDANSETRON HCL 4 MG PO TABS: 4.0000 mg | ORAL_TABLET | Freq: Four times a day (QID) | ORAL | Status: DC | PRN

## 2015-09-18 MED ORDER — ONDANSETRON HCL 4 MG/2ML IJ SOLN: 4.0000 mg | Freq: Four times a day (QID) | INTRAMUSCULAR | Status: DC | PRN

## 2015-09-18 MED ORDER — ENOXAPARIN SODIUM 30 MG/0.3ML ~~LOC~~ SOLN
30.0000 mg | SUBCUTANEOUS | Status: DC
  Administered 2015-09-18: 30 mg via SUBCUTANEOUS
  Filled 2015-09-18: qty 0.3

## 2015-09-18 MED ORDER — BRIMONIDINE TARTRATE 0.2 % OP SOLN
1.0000 [drp] | Freq: Two times a day (BID) | OPHTHALMIC | Status: DC
  Administered 2015-09-18 – 2015-09-20 (×6): 1 [drp] via OPHTHALMIC
  Filled 2015-09-18 (×2): qty 5

## 2015-09-18 MED ORDER — PANTOPRAZOLE SODIUM 40 MG IV SOLR
40.0000 mg | Freq: Every day | INTRAVENOUS | Status: DC
  Administered 2015-09-18 – 2015-09-20 (×3): 40 mg via INTRAVENOUS
  Filled 2015-09-18 (×4): qty 40

## 2015-09-18 MED ORDER — BRIMONIDINE TARTRATE-TIMOLOL 0.2-0.5 % OP SOLN: 1.0000 [drp] | Freq: Two times a day (BID) | OPHTHALMIC | Status: DC

## 2015-09-18 MED ORDER — ACETAMINOPHEN 650 MG RE SUPP: 650.0000 mg | Freq: Four times a day (QID) | RECTAL | Status: DC | PRN

## 2015-09-18 MED ORDER — FENTANYL CITRATE (PF) 100 MCG/2ML IJ SOLN
100.0000 ug | Freq: Once | INTRAMUSCULAR | Status: AC
  Administered 2015-09-18: 100 ug via INTRAVENOUS
  Filled 2015-09-18: qty 2

## 2015-09-18 MED ORDER — HYDRALAZINE HCL 20 MG/ML IJ SOLN: 2.0000 mg | INTRAMUSCULAR | Status: DC | PRN

## 2015-09-18 MED ORDER — FENTANYL CITRATE (PF) 100 MCG/2ML IJ SOLN
25.0000 ug | INTRAMUSCULAR | Status: DC | PRN
  Administered 2015-09-18 – 2015-09-19 (×8): 50 ug via INTRAVENOUS
  Filled 2015-09-18 (×7): qty 2

## 2015-09-18 MED ORDER — CHLORHEXIDINE GLUCONATE 0.12 % MT SOLN
15.0000 mL | Freq: Two times a day (BID) | OROMUCOSAL | Status: DC
  Administered 2015-09-18 – 2015-09-19 (×5): 15 mL via OROMUCOSAL
  Filled 2015-09-18 (×2): qty 15

## 2015-09-18 MED ORDER — PNEUMOCOCCAL VAC POLYVALENT 25 MCG/0.5ML IJ INJ
0.5000 mL | INJECTION | INTRAMUSCULAR | Status: AC
  Administered 2015-09-19: 0.5 mL via INTRAMUSCULAR
  Filled 2015-09-18: qty 0.5

## 2015-09-18 MED ORDER — LATANOPROST 0.005 % OP SOLN
1.0000 [drp] | Freq: Every day | OPHTHALMIC | Status: DC
  Administered 2015-09-18 – 2015-09-19 (×3): 1 [drp] via OPHTHALMIC
  Filled 2015-09-18 (×2): qty 2.5

## 2015-09-18 MED ORDER — BRIMONIDINE TARTRATE 0.2 % OP SOLN: 1.0000 [drp] | Freq: Two times a day (BID) | OPHTHALMIC | Status: DC

## 2015-09-18 MED ORDER — TIMOLOL MALEATE 0.5 % OP SOLN
1.0000 [drp] | Freq: Two times a day (BID) | OPHTHALMIC | Status: DC
  Administered 2015-09-18 – 2015-09-20 (×6): 1 [drp] via OPHTHALMIC
  Filled 2015-09-18 (×2): qty 5

## 2015-09-18 MED ORDER — ACETAMINOPHEN 325 MG PO TABS
650.0000 mg | ORAL_TABLET | Freq: Four times a day (QID) | ORAL | Status: DC | PRN
  Administered 2015-09-20: 650 mg via ORAL
  Filled 2015-09-18: qty 2

## 2015-09-18 MED ORDER — INFLUENZA VAC SPLIT QUAD 0.5 ML IM SUSY
0.5000 mL | PREFILLED_SYRINGE | INTRAMUSCULAR | Status: AC
  Administered 2015-09-19: 0.5 mL via INTRAMUSCULAR
  Filled 2015-09-18: qty 0.5

## 2015-09-18 MED ORDER — FENTANYL CITRATE (PF) 100 MCG/2ML IJ SOLN
50.0000 ug | INTRAMUSCULAR | Status: DC | PRN
  Administered 2015-09-18: 50 ug via INTRAVENOUS
  Filled 2015-09-18: qty 2

## 2015-09-18 NOTE — ED Notes (Signed)
Pt o2 saturation dropped to 82 and this RN put pt on 2L of oxygen

## 2015-09-18 NOTE — H&P (Signed)
[facility] History and Physical  Dominique Frank [id number] DOB: 27-Jan-1914 DOA: 09/17/2015  Referring physician: ED PCP: Elby Showers, MD   Chief Complaint: Abdominal pain with nausea and vomiting  HPI:  Dominique Frank is a 82 year old female with a past medical history significant for hypertension, hyperlipidemia, and dementia; who presented with complaints of multiple episodes of emesis. History is obtained from one of the patient's sons and caregiver as the patient has a history of dementia and is currently resting. Patient had been constipated for the last 2-3 weeks and more recently over the last 2-3 days complaining of abdominal pain. There trying to give her magnesium citrate over ice around 2:30 PM yesterday afternoon. Almost immediately the patient began to throw up brown stuff out of her mouth that was very foul in odor. Family rushed her to the emergency department. Prior to this patient would only have very small amounts of stool produced after straining. Upon admission patient was evaluated and initial lab work showed no acute abnormalities. CT of the abdomen revealed a mass obstructing the bladder and that is subsequently causing mass effect on the rectosigmoid area. Family is made aware and would like to discuss options including surgery. GYN oncology was consulted and the ED physician talked with Dr. Elonda Husky who stated they would evaluate the patient in a.m.   Review of Systems  Unable to perform ROS: acuity of condition  Gastrointestinal: Positive for vomiting and abdominal pain.     Past Medical History  Diagnosis Date  . Hyperlipidemia   . Hypertension   . Osteopenia   . Constipation   . Anxiety   . Glaucoma   . Bowel obstruction [facility])      Past Surgical History  Procedure Laterality Date  . Abdominal hysterectomy    . Hernia repair    . Vag burch suspension    . Bowel obstruction      ? type      Social History:  reports that she has never smoked. She has  never used smokeless tobacco. She reports that she does not drink alcohol or use illicit drugs. Where does patient live--home  and with whom if at home? Family and caregiver Can patient participate in ADLs?Needs assistance  Allergies  Allergen Reactions  . Codeine Nausea Only    Family History  Problem Relation Age of Onset  . Colon cancer Neg Hx   . Stroke Mother      ,   Prior to Admission medications   Medication Sig Start Date End Date Taking? Authorizing Provider  aspirin 81 MG EC tablet Take 81 mg by mouth daily.     Yes Historical Provider, MD  calcium carbonate (OS-CAL) 1250 MG chewable tablet Chew 1 tablet by mouth daily.     Yes Historical Provider, MD  COMBIGAN 0.2-0.5 % ophthalmic solution Place 1 drop into both eyes every 12 (twelve) hours.  11/18/10  Yes Historical Provider, MD  omeprazole (PRILOSEC) 20 MG capsule Take 20 mg by mouth daily.   Yes Historical Provider, MD  PARoxetine (PAXIL) 10 MG tablet TAKE 1 TABLET BY MOUTH DAILY Patient taking differently: TAKE 0.5 TABLET BY MOUTH DAILY 03/19/15  Yes Elby Showers, MD  TRAVATAN Z 0.004 % ophthalmic solution Place 1 drop into both eyes at bedtime.  11/18/10  Yes Historical Provider, MD  saccharomyces boulardii (FLORASTOR) 250 MG capsule Take 1 capsule (250 mg total) by mouth 2 (two) times daily. 08/16/13   Amy Genia Harold, PA-C  Physical Exam: Filed Vitals:   09/18/15 0000 09/18/15 0030 09/18/15 0100 09/18/15 0130  BP: 158/87 158/91 177/96 146/88  Pulse: 80 80 79 81  Temp:      TempSrc:      Resp:      Height:      Weight:      SpO2: 95% 95% 95% 98%     Constitutional: Vital signs reviewed. Patient is a frail elderly female who appears currently in no acute distress and is resting Head: Normocephalic and atraumatic  Ear: TM normal bilaterally  Mouth: no erythema or exudates, MMM  Eyes: PERRL, EOMI, conjunctivae normal, No scleral icterus.  Neck: Supple, Trachea midline normal ROM, No JVD, mass,  thyromegaly, or carotid bruit present.  Cardiovascular: RRR, S1 normal, S2 normal, no MRG, pulses symmetric and intact bilaterally  Pulmonary/Chest: CTAB, no wheezes, rales, or rhonchi  Abdominal: Distended with generalized tenderness to palpation, positive bowel sounds GU: no CVA tenderness Musculoskeletal: No joint deformities, erythema, or stiffness, ROM full and no nontender Ext: no edema and no cyanosis, pulses palpable bilaterally (DP and PT)  Hematology: no cervical, inginal, or axillary adenopathy.  Neurological: A&O x3, Strenght is normal and symmetric bilaterally, cranial nerve II-XII are grossly intact, no focal motor deficit, sensory intact to light touch bilaterally.  Skin: Warm, dry and intact. No rash, cyanosis, or clubbing.  Psychiatric: Normal mood and affect. speech and behavior is normal. Judgment and thought content normal. Cognition and memory are normal.      Data Review   Micro Results No results found for this or any previous visit (from the past 240 hour(s)).  Radiology Reports Ct Abdomen Pelvis W Contrast  09/17/2015  CLINICAL DATA:  Bloating and vomiting EXAM: CT ABDOMEN AND PELVIS WITH CONTRAST TECHNIQUE: Multidetector CT imaging of the abdomen and pelvis was performed using the standard protocol following bolus administration of intravenous contrast. CONTRAST:  29mL OMNIPAQUE IOHEXOL 300 MG/ML  SOLN COMPARISON:  None. FINDINGS: Lung bases demonstrate a small left-sided pleural effusion. No focal infiltrate is noted. Coronary calcifications are seen as well as a moderate-sized hiatal hernia. The liver and gallbladder as well as the spleen are within normal limits. The adrenal glands and pancreas are within normal limits. The kidneys show bilateral cystic change as well as fullness of the left collecting system. This is likely related to a an overly distended bladder as no definitive stone is seen. The bladder demonstrates some increased enhancing changes particularly  on the left suspicious for underlying bladder mass. Further evaluation is recommended. The uterus has been surgically removed. Gaseous distension of the colon is noted as well as some fecal material throughout the colon. No true obstructive changes are seen although significant mass-effect upon the rectosigmoid is noted by the distended bladder. This may be causing a somewhat obstructive pattern. The osseous structures show healed pubic rami fractures as well as generalized osteopenia. Compression deformities are noted at T11 and L1 and to a lesser degree at L3. These all appear chronic in nature. IMPRESSION: Significantly over distended bladder with subsequent dilatation of the left collecting system. There are some enhancing areas within the left half of the bladder which are suspicious for underlying mass lesion. Further evaluation is recommended. The bladder causes some mass-effect upon the rectosigmoid causing some gaseous distention and fecal material within the transverse colon and left colon. Moderate hiatal hernia. Renal cystic changes. Small left pleural effusion. Electronically Signed   By: Inez Catalina M.D.   On: 09/17/2015 21:51  Dg Abd 2 Views  09/05/2015  CLINICAL DATA:  RLQ pain x weeks / no reg BMs / concern for SBO. EXAM: ABDOMEN - 2 VIEW COMPARISON:  07/03/2015 FINDINGS: No evidence of bowel obstruction or significant adynamic ileus. There is no free air. Moderate increased stool is noted in the colon. Soft tissues are unremarkable. There are multiple old compression fractures of the visualized spine. There are old fractures of left superior inferior pubic rami. Bones are diffusely demineralized. IMPRESSION: 1. No acute findings.  No evidence of bowel obstruction. 2. Moderate increased stool in the colon. Electronically Signed   By: Lajean Manes M.D.   On: 09/05/2015 15:26     CBC  Recent Labs Lab 09/17/15 1800  WBC 8.5  HGB 12.7  HCT 39.6  PLT 331  MCV 90.6  MCH 29.1  MCHC 32.1   RDW 13.8    Chemistries   Recent Labs Lab 09/17/15 1800  NA 138  K 3.8  CL 101  CO2 24  GLUCOSE 108*  BUN 12  CREATININE 0.71  CALCIUM 9.2  AST 23  ALT 9*  ALKPHOS 93  BILITOT 0.7   ------------------------------------------------------------------------------------------------------------------ estimated creatinine clearance is 27.5 mL/min (by C-G formula based on Cr of 0.71). ------------------------------------------------------------------------------------------------------------------ No results for input(s): HGBA1C in the last 72 hours. ------------------------------------------------------------------------------------------------------------------ No results for input(s): CHOL, HDL, LDLCALC, TRIG, CHOLHDL, LDLDIRECT in the last 72 hours. ------------------------------------------------------------------------------------------------------------------ No results for input(s): TSH, T4TOTAL, T3FREE, THYROIDAB in the last 72 hours.  Invalid input(s): FREET3 ------------------------------------------------------------------------------------------------------------------ No results for input(s): VITAMINB12, FOLATE, FERRITIN, TIBC, IRON, RETICCTPCT in the last 72 hours.  Coagulation profile No results for input(s): INR, PROTIME in the last 168 hours.  No results for input(s): DDIMER in the last 72 hours.  Cardiac Enzymes No results for input(s): CKMB, TROPONINI, MYOGLOBIN in the last 168 hours.  Invalid input(s): CK ------------------------------------------------------------------------------------------------------------------ Invalid input(s): POCBNP   CBG: No results for input(s): GLUCAP in the last 168 hours.   Assessment/Plan Obstructing pelvic mass: Acute. Patient with 2-3 week history of constipation who presented with acute symptoms of vomiting brown foul-smelling substance today. Physical exam reveals a distended abdomen is tender to palpation. CT of  the abdomen reveals a overly distended bladder with dilatation of the left collecting system with suspicion of underlying mass. The bladder subsequently is causing mass effect upon the rectosigmoid area and there is gaseous distention of the colon. - Admit to MedSurg bed - Family wishes to know options. - GYN /oncology consulted by ED physician and to see in a.m. discussed with Dr. Elonda Husky - Consider consult to interventional radiology for palliative drain placement - Held off starting IV fluids tonight for fear of worsening bladder distention - Discussed with family option of palliative/hospice care if family does not wish to pursue any interventions  Nausea, vomiting, and abdominal pain: secondary to the above - Zofran prn nausea or vomiting  - Fentanyl prn pain - Will place NGT if needed for persistent nausea and vomiting  - Continuous pulse oximetry with nasal cannula oxygen to keep O2 sats greater than 92%  Constipation: Secondary to above  Essential hypertension - Hydralazine prn sbp >180 or dBP >100  Glaucoma - Continue home Combigan and Xalatan  Dementia: Stable  Lovenox for DVT prophylaxis Code Status:  DO NOT RESUSCITATE Communication: Son and caregiver at bedside Disposition Plan: admit   Total time spent 55 minutes.Greater than 50% of this time was spent in counseling, explanation of diagnosis, planning of further management, and coordination of care  Norval Morton [facility] Pager [phone]  If 7PM-7AM, please contact night-coverage [email or web address] Password [email or web address] 09/18/2015, 2:06 AM

## 2015-09-18 NOTE — Care Management Note (Signed)
Case Management Note  Patient Details  Name: Cocoa Leick Arvizu MRN: [id number] Date of Birth: 07/26/1913  Subjective/Objective:                    Action/Plan:  Patient from home with family , currently has palliative care through [facility].  Expected Discharge Date:  09/22/15               Expected Discharge Plan:     In-House Referral:     Discharge planning Services     Post Acute Care Choice:    Choice offered to:     DME Arranged:    DME Agency:     HH Arranged:    HH Agency:     Status of Service:  In process, will continue to follow  Medicare Important Message Given:    Date Medicare IM Given:    Medicare IM give by:    Date Additional Medicare IM Given:    Additional Medicare Important Message give by:     If discussed at [place] of Stay Meetings, dates discussed:    Additional Comments:  Marilu Favre, RN 09/18/2015, 3:21 PM

## 2015-09-18 NOTE — Progress Notes (Addendum)
PROGRESS NOTE  Dominique Frank [id number] DOB: Sep 19, 1913 DOA: 09/17/2015 PCP: Elby Showers, MD  HPI/Recap of past 24 hours:  Lying in bed, not in pain, no active n/v, smiling, baseline dementia, family in room  Assessment/Plan: Principal Problem:   Pelvic mass in female Active Problems:   Dementia   Constipation   Glaucoma   Abdominal pain  Ovarian mass with big obstructing cystic lesion causing bowel and left ureteral obstruction: prn pain meds, currently no pain, no n/v. Npo on ivf.  I have talked to gyn on call Dr constant who recommended to contact gyn oncology, Dr Elly Modena already talked to gyn oncology this am. I have also called [facility] office at [phone], requests patient to be seen today, per office staff, Dr Everitt Amber is currently in surgery, they will relate this consult  to Dr Denman George, i have also left my personal contact number in case Dr Denman George needs to discuss the case.   Per family, they doe not wish aggressive treatment, but drain the cystic lesion to relieve her obstruction symptom will be ok.  Family is very supportive and understanding, they wish to talk to palliative team as well, they are currently consider home hospice and want the patient to be discharged home as soon as possible due to prior history of inhospital delirium if remain hospitalized too long. Palliative care consulted as well.  Addendum: 3Pm; I have talked to gyn onc Dr. Denman George over the phone who recommended IR drain for cystic mass, and sent cytology, per Dr Denman George this lesion may not be malignant, she does not recommend aggressive treatment even if this is malignant considering patient 's advanced age with underline dementia. Patient can follow up with her if needed.  I talked to IR Dr Pascal Lux who recommended Ct guided drain in am, keep npo after midnight, patient is continued on ivf.  Family updated, aware of plan.  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  This measures approximately 15 by 9.5 cm in greatest dimension. It causes mass effect upon the colon as previously described. It likely also causes compression of the distal left ureter causing the previously described dilatation of the collecting system on the left.  Dementia: at baseline  Nutrition: currently npo due to obstructing pelvic lesion, on ivf.  Code Status: DNR  Family Communication: patient and family  Disposition Plan: pending, likely home hospice in 1-2 days   Consultants:  obgyn Dr Elly Modena  Gyn onc Dr. Denman George  Palliative care  Procedures:  none  Antibiotics:  none   Objective: BP 143/67 mmHg  Pulse 68  Temp(Src) 98.1 F (36.7 C) (Oral)  Resp 16  Ht 5' (1.524 m)  Wt 49.442 kg (109 lb)  BMI 21.29 kg/m2  SpO2 95%  Intake/Output Summary (Last 24 hours) at 09/18/15 1406 Last data filed at 09/18/15 0700  Gross per 24 hour  Intake      0 ml  Output    250 ml  Net   -250 ml   Filed Weights   09/17/15 1732  Weight: 49.442 kg (109 lb)    Exam:   General:  NAD,   Cardiovascular: RRR with frequent ectopic beats  Respiratory: CTABL  Abdomen: distended, Soft/NT, positive BS  Musculoskeletal: No Edema  Neuro: demented, but pleasant and following command.   Data Reviewed: Basic Metabolic Panel:  Recent Labs Lab 09/17/15 1800 09/18/15 0640  NA 138 137  K 3.8 3.9  CL 101 102  CO2  24 26  GLUCOSE 108* 105*  BUN 12 12  CREATININE 0.71 0.65  CALCIUM 9.2 8.5*   Liver Function Tests:  Recent Labs Lab 09/17/15 1800  AST 23  ALT 9*  ALKPHOS 93  BILITOT 0.7  PROT 6.4*  ALBUMIN 3.2*    Recent Labs Lab 09/17/15 1800  LIPASE 31   No results for input(s): AMMONIA in the last 168 hours. CBC:  Recent Labs Lab 09/17/15 1800 09/18/15 0640  WBC 8.5 8.4  HGB 12.7 11.1*  HCT 39.6 33.6*  MCV 90.6 90.1  PLT 331 270   Cardiac Enzymes:   No results for input(s): CKTOTAL, CKMB, CKMBINDEX, TROPONINI in the last 168  hours. BNP (last 3 results) No results for input(s): BNP in the last 8760 hours.  ProBNP (last 3 results) No results for input(s): PROBNP in the last 8760 hours.  CBG: No results for input(s): GLUCAP in the last 168 hours.  No results found for this or any previous visit (from the past 240 hour(s)).   Studies: Ct Abdomen Pelvis W Contrast  09/18/2015  ADDENDUM REPORT: 09/18/2015 08:08 ADDENDUM: Further clinical information is been given following placement of a Foley catheter. Only a small amount of urine was withdrawn. Further evaluation of the imaging shows that the previously described over distended bladder represents a likely left ovarian cystic neoplasm with peripheral soft tissue nodularity. This measures approximately 15 by 9.5 cm in greatest dimension. It causes mass effect upon the colon as previously described. It likely also causes compression of the distal left ureter causing the previously described dilatation of the collecting system on the left. Electronically Signed   By: Inez Catalina M.D.   On: 09/18/2015 08:08  09/18/2015  CLINICAL DATA:  Bloating and vomiting EXAM: CT ABDOMEN AND PELVIS WITH CONTRAST TECHNIQUE: Multidetector CT imaging of the abdomen and pelvis was performed using the standard protocol following bolus administration of intravenous contrast. CONTRAST:  55mL OMNIPAQUE IOHEXOL 300 MG/ML  SOLN COMPARISON:  None. FINDINGS: Lung bases demonstrate a small left-sided pleural effusion. No focal infiltrate is noted. Coronary calcifications are seen as well as a moderate-sized hiatal hernia. The liver and gallbladder as well as the spleen are within normal limits. The adrenal glands and pancreas are within normal limits. The kidneys show bilateral cystic change as well as fullness of the left collecting system. This is likely related to a an overly distended bladder as no definitive stone is seen. The bladder demonstrates some increased enhancing changes particularly on the  left suspicious for underlying bladder mass. Further evaluation is recommended. The uterus has been surgically removed. Gaseous distension of the colon is noted as well as some fecal material throughout the colon. No true obstructive changes are seen although significant mass-effect upon the rectosigmoid is noted by the distended bladder. This may be causing a somewhat obstructive pattern. The osseous structures show healed pubic rami fractures as well as generalized osteopenia. Compression deformities are noted at T11 and L1 and to a lesser degree at L3. These all appear chronic in nature. IMPRESSION: Significantly over distended bladder with subsequent dilatation of the left collecting system. There are some enhancing areas within the left half of the bladder which are suspicious for underlying mass lesion. Further evaluation is recommended. The bladder causes some mass-effect upon the rectosigmoid causing some gaseous distention and fecal material within the transverse colon and left colon. Moderate hiatal hernia. Renal cystic changes. Small left pleural effusion. Electronically Signed: By: Inez Catalina M.D. On: 09/17/2015 21:51  Scheduled Meds: . antiseptic oral rinse  7 mL Mouth Rinse q12n4p  . brimonidine  1 drop Both Eyes BID  . chlorhexidine  15 mL Mouth Rinse BID  . enoxaparin (LOVENOX) injection  30 mg Subcutaneous Q24H  . [START ON 09/19/2015] Influenza vac split quadrivalent PF  0.5 mL Intramuscular Tomorrow-1000  . latanoprost  1 drop Both Eyes QHS  . pantoprazole (PROTONIX) IV  40 mg Intravenous Daily  . [START ON 09/19/2015] pneumococcal 23 valent vaccine  0.5 mL Intramuscular Tomorrow-1000  . timolol  1 drop Both Eyes BID    Continuous Infusions: . dextrose 5 % and 0.9 % NaCl with KCl 20 mEq/L       Time spent: 13mins from 1:35 to 2:10, more than 50%  Time spent on coordination of call by calling consult, talk to subspecialty and updating family.  Khandi Kernes MD, PhD  [facility] Pager [phone]. If 7PM-7AM, please contact night-coverage at [email or web address], password [facility] 09/18/2015, 2:06 PM

## 2015-09-19 ENCOUNTER — Inpatient Hospital Stay (HOSPITAL_COMMUNITY): Payer: Medicare Other

## 2015-09-19 DIAGNOSIS — F0390 Unspecified dementia without behavioral disturbance: Secondary | ICD-10-CM | POA: Diagnosis not present

## 2015-09-19 DIAGNOSIS — M8580 Other specified disorders of bone density and structure, unspecified site: Secondary | ICD-10-CM | POA: Diagnosis not present

## 2015-09-19 DIAGNOSIS — R109 Unspecified abdominal pain: Secondary | ICD-10-CM

## 2015-09-19 DIAGNOSIS — Z23 Encounter for immunization: Secondary | ICD-10-CM | POA: Diagnosis not present

## 2015-09-19 DIAGNOSIS — E785 Hyperlipidemia, unspecified: Secondary | ICD-10-CM | POA: Diagnosis not present

## 2015-09-19 DIAGNOSIS — I10 Essential (primary) hypertension: Secondary | ICD-10-CM | POA: Diagnosis not present

## 2015-09-19 DIAGNOSIS — F419 Anxiety disorder, unspecified: Secondary | ICD-10-CM | POA: Diagnosis not present

## 2015-09-19 DIAGNOSIS — N839 Noninflammatory disorder of ovary, fallopian tube and broad ligament, unspecified: Secondary | ICD-10-CM

## 2015-09-19 DIAGNOSIS — Z515 Encounter for palliative care: Secondary | ICD-10-CM | POA: Insufficient documentation

## 2015-09-19 DIAGNOSIS — N135 Crossing vessel and stricture of ureter without hydronephrosis: Secondary | ICD-10-CM

## 2015-09-19 DIAGNOSIS — Z7189 Other specified counseling: Secondary | ICD-10-CM | POA: Insufficient documentation

## 2015-09-19 DIAGNOSIS — N83202 Unspecified ovarian cyst, left side: Secondary | ICD-10-CM | POA: Diagnosis not present

## 2015-09-19 DIAGNOSIS — D649 Anemia, unspecified: Secondary | ICD-10-CM | POA: Diagnosis not present

## 2015-09-19 DIAGNOSIS — H409 Unspecified glaucoma: Secondary | ICD-10-CM | POA: Diagnosis not present

## 2015-09-19 DIAGNOSIS — Z66 Do not resuscitate: Secondary | ICD-10-CM | POA: Diagnosis not present

## 2015-09-19 DIAGNOSIS — Z7982 Long term (current) use of aspirin: Secondary | ICD-10-CM | POA: Diagnosis not present

## 2015-09-19 LAB — BASIC METABOLIC PANEL
ANION GAP: 8 (ref 5–15)
BUN: 17 mg/dL (ref 6–20)
CHLORIDE: 107 mmol/L (ref 101–111)
CO2: 24 mmol/L (ref 22–32)
Calcium: 8.4 mg/dL — ABNORMAL LOW (ref 8.9–10.3)
Creatinine, Ser: 0.77 mg/dL (ref 0.44–1.00)
GFR calc non Af Amer: >60 mL/min (ref >60)
Glucose, Bld: 130 mg/dL — ABNORMAL HIGH (ref 65–99)
POTASSIUM: 4.2 mmol/L (ref 3.5–5.1)
Sodium: 139 mmol/L (ref 135–145)

## 2015-09-19 LAB — APTT: APTT: 28 s (ref 24–37)

## 2015-09-19 LAB — PROTIME-INR
INR: 1.2 (ref 0.00–1.49)
PROTHROMBIN TIME: 15.4 s — AB (ref 11.6–15.2)

## 2015-09-19 LAB — MAGNESIUM: Magnesium: 2.3 mg/dL (ref 1.7–2.4)

## 2015-09-19 MED ORDER — LIDOCAINE HCL 1 % IJ SOLN
INTRAMUSCULAR | Status: AC
  Filled 2015-09-19: qty 20

## 2015-09-19 MED ORDER — ENOXAPARIN SODIUM 30 MG/0.3ML ~~LOC~~ SOLN
30.0000 mg | SUBCUTANEOUS | Status: DC
  Administered 2015-09-20: 30 mg via SUBCUTANEOUS
  Filled 2015-09-19: qty 0.3

## 2015-09-19 MED ORDER — MIDAZOLAM HCL 2 MG/2ML IJ SOLN
INTRAMUSCULAR | Status: AC | PRN
  Administered 2015-09-19 (×2): 0.5 mg via INTRAVENOUS

## 2015-09-19 MED ORDER — MIDAZOLAM HCL 2 MG/2ML IJ SOLN
INTRAMUSCULAR | Status: AC
Start: 2015-09-19 — End: 2015-09-20
  Filled 2015-09-19: qty 2

## 2015-09-19 MED ORDER — FENTANYL CITRATE (PF) 100 MCG/2ML IJ SOLN
INTRAMUSCULAR | Status: AC
  Administered 2015-09-19: 50 ug via INTRAVENOUS
  Filled 2015-09-19: qty 2

## 2015-09-19 MED ORDER — PROMETHAZINE HCL 25 MG/ML IJ SOLN: 12.5000 mg | Freq: Four times a day (QID) | INTRAMUSCULAR | Status: DC | PRN

## 2015-09-19 MED ORDER — FENTANYL CITRATE (PF) 100 MCG/2ML IJ SOLN
INTRAMUSCULAR | Status: AC | PRN
  Administered 2015-09-19 (×2): 25 ug via INTRAVENOUS

## 2015-09-19 NOTE — Consult Note (Signed)
Chief Complaint: Patient was seen in consultation today for ovarian cyst aspiration/drain Chief Complaint  Patient presents with  . Abdominal Pain  . Constipation  . Emesis   at the request of Florencia Reasons MD  Referring Physician(s): Dr Dillard Cannon Dr Everitt Amber  Supervising Physician: Arne Cleveland  History of Present Illness: Dominique Frank is a 82 y.o. female   Pelvic mass Dementia; constipation Abdominal pain; distension N/V CT 09/18/15: Further evaluation of the imaging shows that the previously described over distended bladder represents a likely left ovarian cystic neoplasm with peripheral soft tissue nodularity. This measures approximately 15 by 9.5 cm in greatest dimension. It causes mass effect upon the colon as previously described. It likely also causes compression of the distal left ureter causing the previously described dilatation of the collecting system on the left.  Request per Palliative MD and Dr Erlinda Hong for aspiration/drain Dr Everitt Amber OB/GYN had been consulted and recommended evaluation with IR Imaging has been reviewed with Dr Pascal Lux and Dr Valentina Shaggy procedure Procedure is for Palliative reasons; no need for tissue diagnosis per Palliative team  Past Medical History  Diagnosis Date  . Hyperlipidemia   . Hypertension   . Osteopenia   . Constipation   . Anxiety   . Glaucoma   . Bowel obstruction [facility])     Past Surgical History  Procedure Laterality Date  . Abdominal hysterectomy    . Hernia repair    . Vag burch suspension    . Bowel obstruction      ? type    Allergies: Codeine  Medications: Prior to Admission medications   Medication Sig Start Date End Date Taking? Authorizing Provider  aspirin 81 MG EC tablet Take 81 mg by mouth daily.     Yes Historical Provider, MD  calcium carbonate (OS-CAL) 1250 MG chewable tablet Chew 1 tablet by mouth daily.     Yes Historical Provider, MD  COMBIGAN 0.2-0.5 % ophthalmic solution Place 1 drop  into both eyes every 12 (twelve) hours.  11/18/10  Yes Historical Provider, MD  omeprazole (PRILOSEC) 20 MG capsule Take 20 mg by mouth daily.   Yes Historical Provider, MD  PARoxetine (PAXIL) 10 MG tablet TAKE 1 TABLET BY MOUTH DAILY Patient taking differently: TAKE 0.5 TABLET BY MOUTH DAILY 03/19/15  Yes Elby Showers, MD  TRAVATAN Z 0.004 % ophthalmic solution Place 1 drop into both eyes at bedtime.  11/18/10  Yes Historical Provider, MD  saccharomyces boulardii (FLORASTOR) 250 MG capsule Take 1 capsule (250 mg total) by mouth 2 (two) times daily. 08/16/13   Amy S Esterwood, PA-C     Family History  Problem Relation Age of Onset  . Colon cancer Neg Hx   . Stroke Mother     Social History   Social History  . Marital Status: Widowed    Spouse Name: N/A  . Number of Children: 4  . Years of Education: N/A   Social History Main Topics  . Smoking status: Never Smoker   . Smokeless tobacco: Never Used  . Alcohol Use: No  . Drug Use: No  . Sexual Activity: No   Other Topics Concern  . None   Social History Narrative     Review of Systems: A 12 point ROS discussed and pertinent positives are indicated in the HPI above.  All other systems are negative.  Review of Systems  Constitutional: Positive for activity change, appetite change and fatigue. Negative for fever.  Respiratory:  Negative for shortness of breath.   Cardiovascular: Negative for chest pain.  Gastrointestinal: Positive for nausea, vomiting, abdominal pain and constipation.  Genitourinary: Positive for pelvic pain.  Musculoskeletal: Positive for gait problem. Negative for back pain.  Neurological: Positive for weakness.  Psychiatric/Behavioral: Positive for confusion.    Vital Signs: BP 136/75 mmHg  Pulse 82  Temp(Src) 98.2 F (36.8 C) (Oral)  Resp 16  Ht 5' (1.524 m)  Wt 109 lb (49.442 kg)  BMI 21.29 kg/m2  SpO2 94%  Physical Exam  Cardiovascular: Normal rate and regular rhythm.   Pulmonary/Chest:  Effort normal.  Abdominal: Soft. She exhibits distension.  Musculoskeletal: Normal range of motion.  Neurological:  Resting/sleeping  Skin: Skin is warm and dry.  Psychiatric:  Family at bedside  Consented with son  Nursing note and vitals reviewed.   Mallampati Score:  MD Evaluation Airway: WNL Heart: WNL Abdomen: WNL Chest/ Lungs: WNL ASA  Classification: 3 Mallampati/Airway Score: Two  Imaging: Ct Abdomen Pelvis W Contrast  09/18/2015  ADDENDUM REPORT: 09/18/2015 08:08 ADDENDUM: Further clinical information is been given following placement of a Foley catheter. Only a small amount of urine was withdrawn. Further evaluation of the imaging shows that the previously described over distended bladder represents a likely left ovarian cystic neoplasm with peripheral soft tissue nodularity. This measures approximately 15 by 9.5 cm in greatest dimension. It causes mass effect upon the colon as previously described. It likely also causes compression of the distal left ureter causing the previously described dilatation of the collecting system on the left. Electronically Signed   By: Inez Catalina M.D.   On: 09/18/2015 08:08  09/18/2015  CLINICAL DATA:  Bloating and vomiting EXAM: CT ABDOMEN AND PELVIS WITH CONTRAST TECHNIQUE: Multidetector CT imaging of the abdomen and pelvis was performed using the standard protocol following bolus administration of intravenous contrast. CONTRAST:  24mL OMNIPAQUE IOHEXOL 300 MG/ML  SOLN COMPARISON:  None. FINDINGS: Lung bases demonstrate a small left-sided pleural effusion. No focal infiltrate is noted. Coronary calcifications are seen as well as a moderate-sized hiatal hernia. The liver and gallbladder as well as the spleen are within normal limits. The adrenal glands and pancreas are within normal limits. The kidneys show bilateral cystic change as well as fullness of the left collecting system. This is likely related to a an overly distended bladder as no  definitive stone is seen. The bladder demonstrates some increased enhancing changes particularly on the left suspicious for underlying bladder mass. Further evaluation is recommended. The uterus has been surgically removed. Gaseous distension of the colon is noted as well as some fecal material throughout the colon. No true obstructive changes are seen although significant mass-effect upon the rectosigmoid is noted by the distended bladder. This may be causing a somewhat obstructive pattern. The osseous structures show healed pubic rami fractures as well as generalized osteopenia. Compression deformities are noted at T11 and L1 and to a lesser degree at L3. These all appear chronic in nature. IMPRESSION: Significantly over distended bladder with subsequent dilatation of the left collecting system. There are some enhancing areas within the left half of the bladder which are suspicious for underlying mass lesion. Further evaluation is recommended. The bladder causes some mass-effect upon the rectosigmoid causing some gaseous distention and fecal material within the transverse colon and left colon. Moderate hiatal hernia. Renal cystic changes. Small left pleural effusion. Electronically Signed: By: Inez Catalina M.D. On: 09/17/2015 21:51   Dg Abd 2 Views  09/05/2015  CLINICAL DATA:  RLQ pain x weeks / no reg BMs / concern for SBO. EXAM: ABDOMEN - 2 VIEW COMPARISON:  07/03/2015 FINDINGS: No evidence of bowel obstruction or significant adynamic ileus. There is no free air. Moderate increased stool is noted in the colon. Soft tissues are unremarkable. There are multiple old compression fractures of the visualized spine. There are old fractures of left superior inferior pubic rami. Bones are diffusely demineralized. IMPRESSION: 1. No acute findings.  No evidence of bowel obstruction. 2. Moderate increased stool in the colon. Electronically Signed   By: Lajean Manes M.D.   On: 09/05/2015 15:26     Labs:  CBC:  Recent Labs  11/11/14 1646 09/05/15 1444 09/17/15 1800 09/18/15 0640  WBC 7.8 5.7 8.5 8.4  HGB 12.9 13.4 12.7 11.1*  HCT 38.1 40.1 39.6 33.6*  PLT 319 306 331 270    COAGS: No results for input(s): INR, APTT in the last 8760 hours.  BMP:  Recent Labs  11/11/14 1646 09/17/15 1800 09/18/15 0640 09/19/15 0420  NA 134* 138 137 139  K 5.4* 3.8 3.9 4.2  CL 101 101 102 107  CO2 20 24 26 24   GLUCOSE 83 123XX123* 105* 130*  BUN 14 12 12 17   CALCIUM 9.3 9.2 8.5* 8.4*  CREATININE 0.74 0.71 0.65 0.77  GFRNONAA  --  >60 >60 >60  GFRAA  --  >60 >60 >60    LIVER FUNCTION TESTS:  Recent Labs  11/11/14 1646 09/17/15 1800  BILITOT 0.3 0.7  AST 22 23  ALT 8 9*  ALKPHOS 86 93  PROT 6.7 6.4*  ALBUMIN 3.5 3.2*    TUMOR MARKERS: No results for input(s): AFPTM, CEA, CA199, CHROMGRNA in the last 8760 hours.  Assessment and Plan:  Pelvic mass Ovarian cyst neoplasm Request for aspiration/drain for palliative/comfort reasons No need for tissue diagnosis Risks and Benefits discussed with the patient's family including bleeding, infection, damage to adjacent structures, bowel perforation/fistula connection, and sepsis. All of the families questions were answered, patient is agreeable to proceed. Consent signed and in chart.   Thank you for this interesting consult.  I greatly enjoyed meeting Dominique Frank and look forward to participating in their care.  A copy of this report was sent to the requesting provider on this date.  Electronically Signed: Ariya Bohannon A 09/19/2015, 11:04 AM   I spent a total of 40 Minutes    in face to face in clinical consultation, greater than 50% of which was counseling/coordinating care for ovarian cyst aspiration/drain

## 2015-09-19 NOTE — Procedures (Signed)
CT pelvic aspiration 855ml pale yellow fluid to cyto No complication No blood loss. See complete dictation in [facility].

## 2015-09-19 NOTE — Sedation Documentation (Signed)
Patient is resting comfortably. 

## 2015-09-19 NOTE — Progress Notes (Signed)
PROGRESS NOTE  Dominique Frank [id number] DOB: 1914/03/24 DOA: 09/17/2015 PCP: Elby Showers, MD  Brief narrative: 82 year old female patient with history of advanced dementia, HTN, HLD, presented to ED on 09/17/15 with multiple episodes of emesis and abdominal pain. She had been constipated for 2-3 weeks PTA and family tried to give her magnesium citrate on day of admission which caused worsening of symptoms. CT abdomen revealed large ovarian mass causing mass effect on colon and bladder. GYN oncology consulted and recommended IR to drain the cystic mass for comfort-awaiting same.   Assessment/Plan: Principal Problem:   Pelvic mass in female Active Problems:   Dementia   Constipation   Glaucoma   Abdominal pain   Intestinal obstruction (HCC)   Ovarian mass   Ureteral obstruction, left  Left ovarian cystic neoplasm causing bowel and left ureteral obstruction:  - prn pain meds, currently no pain, no n/v. Npo on ivf. - Dr. Florencia Reasons, Hospitalist colleague has coordinated care on 3/16: She discussed with Dr. Everitt Amber, GYN oncology who recommended IR drain the cystic mass and send fluid for cytology. She indicated that this lesion may be benign or malignant and even if malignant, would not recommend aggressive treatment given her advanced age and advanced dementia. - Per family, they do not wish aggressive treatment, but drain the cystic lesion to relieve her obstruction symptom will be ok.  Family is very supportive and understanding, they wish to talk to palliative team as well, they are currently consider home hospice and want the patient to be discharged home as soon as possible due to prior history of inhospital delirium if remain hospitalized too long. Palliative care consulted as well. - Resume diet post procedure.   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 This measures approximately 15 by 9.5 cm in greatest dimension. It causes mass effect  upon the colon as previously described. It likely also causes compression of the distal left ureter causing the previously described dilatation of the collecting system on the left.  Advanced Dementia:  - at baseline  Anemia - Follow CBCs  Essential hypertension - Reasonable inpatient control.   DVT prophylaxis: Lovenox Code Status: DNR Family Communication: Discussed with patient's 2 sons, a friend and a caregiver at bedside on 3/17 Disposition Plan: pending, likely home hospice in 1-2 days   Consultants:  OB/GYN: Dr Elly Modena  GYN oncology: Dr. Denman George  Palliative care  Interventional radiology  Procedures:  None  Antibiotics:  None  Subjective - Patient pleasantly confused. On and off mild lower abdominal pain and knee pain.  Objective:  Filed Vitals:   09/18/15 0640 09/18/15 1416 09/18/15 2159 09/19/15 0727  BP: 143/67 169/94 162/66 136/75  Pulse: 68 77 77 82  Temp: 98.1 F (36.7 C) 98 F (36.7 C) 99 F (37.2 C) 98.2 F (36.8 C)  TempSrc: Oral Oral Oral Oral  Resp: 16 16 17 16   Height:      Weight:      SpO2: 95% 100% 95% 94%     Intake/Output Summary (Last 24 hours) at 09/19/15 1037 Last data filed at 09/19/15 0900  Gross per 24 hour  Intake    900 ml  Output    356 ml  Net    544 ml   Filed Weights   09/17/15 1732  Weight: 49.442 kg (109 lb)    Exam:   General:  Small built and frail pleasant elderly female lying comfortably supine in bed.  Cardiovascular: S1 and  S2 heard, RRR. No JVD, murmurs or pedal edema.  Respiratory: Clear to auscultation. No increased work of breathing.  Abdomen: Mildly distended, soft and nontender. Suprapubic nonpulsatile mass felt.  Musculoskeletal: No Edema  Neuro: Alert and oriented to self. No focal deficits.   Data Reviewed: Basic Metabolic Panel:  Recent Labs Lab 09/17/15 1800 09/18/15 0640 09/19/15 0420  NA 138 137 139  K 3.8 3.9 4.2  CL 101 102 107  CO2 24 26 24   GLUCOSE 108* 105*  130*  BUN 12 12 17   CREATININE 0.71 0.65 0.77  CALCIUM 9.2 8.5* 8.4*  MG  --   --  2.3   Liver Function Tests:  Recent Labs Lab 09/17/15 1800  AST 23  ALT 9*  ALKPHOS 93  BILITOT 0.7  PROT 6.4*  ALBUMIN 3.2*    Recent Labs Lab 09/17/15 1800  LIPASE 31   No results for input(s): AMMONIA in the last 168 hours. CBC:  Recent Labs Lab 09/17/15 1800 09/18/15 0640  WBC 8.5 8.4  HGB 12.7 11.1*  HCT 39.6 33.6*  MCV 90.6 90.1  PLT 331 270   Cardiac Enzymes:   No results for input(s): CKTOTAL, CKMB, CKMBINDEX, TROPONINI in the last 168 hours. BNP (last 3 results) No results for input(s): BNP in the last 8760 hours.  ProBNP (last 3 results) No results for input(s): PROBNP in the last 8760 hours.  CBG: No results for input(s): GLUCAP in the last 168 hours.  No results found for this or any previous visit (from the past 240 hour(s)).   Studies: No results found.  Scheduled Meds: . antiseptic oral rinse  7 mL Mouth Rinse q12n4p  . brimonidine  1 drop Both Eyes BID  . chlorhexidine  15 mL Mouth Rinse BID  . enoxaparin (LOVENOX) injection  30 mg Subcutaneous Q24H  . latanoprost  1 drop Both Eyes QHS  . pantoprazole (PROTONIX) IV  40 mg Intravenous Daily  . pneumococcal 23 valent vaccine  0.5 mL Intramuscular Tomorrow-1000  . timolol  1 drop Both Eyes BID    Continuous Infusions: . dextrose 5 % and 0.9 % NaCl with KCl 20 mEq/L 75 mL/hr at 09/19/15 0302     Time spent: 25 minutes.   Vernell Leep, MD, FACP, FHM. [facility] Pager [phone]  If 7PM-7AM, please contact night-coverage [email or web address] Password [email or web address] 09/19/2015, 10:48 AM   LOS: 1 day

## 2015-09-19 NOTE — Progress Notes (Signed)
PT Cancellation Note  Patient Details Name: Dominique Frank MRN: [id number] DOB: 03/06/1914   Cancelled Treatment:    Reason Eval/Treat Not Completed: Patient at procedure or test/unavailable; patient down for ovarian cyst aspiration/drain in IR.  Spoke with family who report she has been up earlier today with their assist.  Not sure PT can offer anything for her if she is discharged later today or early in AM.  Will check back later tomorrow if not d/c.  Reginia Naas 09/19/2015, 2:56 PM  Magda Kiel, [place] 09/19/2015

## 2015-09-19 NOTE — Sedation Documentation (Signed)
Cloudy yellowish drainage aspirated

## 2015-09-19 NOTE — Consult Note (Signed)
Consultation Note Date: 09/19/2015   Patient Name: Dominique Frank  DOB: May 24, 1914  MRN: [id number]  Age / Sex: 82 y.o., female  PCP: Elby Showers, MD Referring Physician: Modena Jansky, MD  Reason for Consultation: Hospice Evaluation and Pain control  82 yo female with incredibly supportive family - hx of HTN, HLD, Bowel obstruction, chronic constipation, presented with vomiting/abd pain and distention after two weeks of constipation.  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  Family does want want surgery.  She has been admitted for comfort measures only - specifically drainage of this mass to attempt to relieve the obstruction and provide comfort.    As soon as the mass is drained and the patient stabilized she will be discharged to home.  The family wants her to pass at home with hospice services.  Clinical Assessment/Narrative: The patient is pleasantly demented and was unable to contribute to her own history.  Per [place] and Jenny Reichmann their mother had been functional until approximately 2 weeks ago.  She was walking unassisted and eating at the table with the family three times a day.  She began to develop constipation, bloating and abdominal pain.  Suppositories were given but ineffective.  After a few days her sons gave her mag citrate and the patient had a bowel movement and felt better.  Unfortunately her symptoms recurred - her sons obtained a test for UTI, contacted their PCP, Dr. Renold Genta, and contacted [place], NP, [place].  This time mag citrate did not relieve her symptoms and she began to have severe abdominal pain and vomit dark colored foul smelling liquid.   She was rushed to the emergency room.  [place], John and [place] appear to take excellent care  of Dominique Frank.  Her sons tell me she does not like to be alone and consequently is never left alone.  She does require assistance with all ADLs.    Now in the hospital Dominique. Frank is unable to eat or drink without vomiting.  She is in too much pain to get out of bed to chair.  IR has evaluated her and the plan is to attempt to drain the lesion and relieve her obstruction for palliative purposes.   The family is not interested in aggressive surgical measures and plans to take their mother home as quickly as possible in order to avoid hospital induced delirium - which she has experienced in the past.   Once palliative measure are completed the family will take Dominique Frank to their home with hospice services.   They have chosen to work with [facility] and will evolve to [place].  Contacts/Participants in Discussion:  Elizebeth Koller, Dominique. [place]. Primary Decision Maker: Jenny Reichmann and [place]. Relationship to Patient  Sons.   SUMMARY OF RECOMMENDATIONS  Code Status/Advance Care Planning: DNR     Symptom Management:   Agree with fentanyl currently ordered for pain.  Will request frequent pain assessment  Once mass is drained, it would seem appropriate to determine if the patient is able to eat, urinate and pass stool.  If she is unable to do so - additional measure may need to be taken prior to d/c (foley cath, enemas - if there is a low risk of perforation)  Palliative Prophylaxis:   Bowel Regimen, Delirium Protocol, Frequent Pain Assessment and Turn Reposition  Additional Recommendations (Limitations, Scope, Preferences):  Full [place] with hospice services.  Psycho-social/Spiritual:  Support System: Strong Additional Recommendations: Caregiving  Support/Resources  Prognosis: Less that 6 months given ovarian malignancy and advanced age.  Could be much shorter if the patient is unable to take nutrition or have bowel movements.  Discharge Planning: Home  with Home Health   Chief Complaint/ Primary Diagnoses: Present on Admission:  . Abdominal pain . Pelvic mass in female . Glaucoma . Dementia . Constipation  I have reviewed the medical record, interviewed the patient and family, and examined the patient. The following aspects are pertinent.  Past Medical History  Diagnosis Date  . Hyperlipidemia   . Hypertension   . Osteopenia   . Constipation   . Anxiety   . Glaucoma   . Bowel obstruction [facility])    Social History   Social History  . Marital Status: Widowed    Spouse Name: N/A  . Number of Children: 4  . Years of Education: N/A   Social History Main Topics  . Smoking status: Never Smoker   . Smokeless tobacco: Never Used  . Alcohol Use: No  . Drug Use: No  . Sexual Activity: No   Other Topics Concern  . None   Social History Narrative   Family History  Problem Relation Age of Onset  . Colon cancer Neg Hx   . Stroke Mother    Scheduled Meds: . antiseptic oral rinse  7 mL Mouth Rinse q12n4p  . brimonidine  1 drop Both Eyes BID  . chlorhexidine  15 mL Mouth Rinse BID  . enoxaparin (LOVENOX) injection  30 mg Subcutaneous Q24H  . latanoprost  1 drop Both Eyes QHS  . pantoprazole (PROTONIX) IV  40 mg Intravenous Daily  . timolol  1 drop Both Eyes BID   Continuous Infusions: . dextrose 5 % and 0.9 % NaCl with KCl 20 mEq/L 50 mL/hr at 09/19/15 1108   PRN Meds:.acetaminophen **OR** acetaminophen, fentaNYL (SUBLIMAZE) injection, hydrALAZINE, levalbuterol, ondansetron **OR** ondansetron (ZOFRAN) IV, promethazine Medications Prior to Admission:  Prior to Admission medications   Medication Sig Start Date End Date Taking? Authorizing Provider  aspirin 81 MG EC tablet Take 81 mg by mouth daily.     Yes Historical Provider, MD  calcium carbonate (OS-CAL) 1250 MG chewable tablet Chew 1 tablet by mouth daily.     Yes Historical Provider, MD  COMBIGAN 0.2-0.5 % ophthalmic solution Place 1 drop into both eyes every 12  (twelve) hours.  11/18/10  Yes Historical Provider, MD  omeprazole (PRILOSEC) 20 MG capsule Take 20 mg by mouth daily.   Yes Historical Provider, MD  PARoxetine (PAXIL) 10 MG tablet TAKE 1 TABLET BY MOUTH DAILY Patient taking differently: TAKE 0.5 TABLET BY MOUTH DAILY 03/19/15  Yes Elby Showers, MD  TRAVATAN Z 0.004 % ophthalmic solution Place 1 drop into both eyes at bedtime.  11/18/10  Yes Historical Provider, MD  saccharomyces boulardii (FLORASTOR) 250 MG capsule Take 1 capsule (250 mg total) by mouth 2 (two) times daily. 08/16/13   Amy S [place], PA-C   Allergies  Allergen Reactions  . Codeine Nausea Only    Review of Systems:  Patient is pleasantly demented  Physical Exam  Elderly, pleasantly demented, very frail CV Irreg Resp  Clear, no respiratory distress. Abdomen:  Distended, tight, tender to palpation, +BS Extremities:  Able to move all 4.  No edema.  Vital Signs: BP 136/75 mmHg  Pulse 82  Temp(Src)  98.2 F (36.8 C) (Oral)  Resp 16  Ht 5' (1.524 m)  Wt 49.442 kg (109 lb)  BMI 21.29 kg/m2  SpO2 94%  SpO2: SpO2: 94 % O2 Device:SpO2: 94 % O2 Flow Rate: .O2 Flow Rate (L/min): 2 L/min  IO: Intake/output summary:   Intake/Output Summary (Last 24 hours) at 09/19/15 1333 Last data filed at 09/19/15 0900  Gross per 24 hour  Intake    900 ml  Output    105 ml  Net    795 ml    LBM: Last BM Date: 09/12/15 Baseline Weight: Weight: 49.442 kg (109 lb) Most recent weight: Weight: 49.442 kg (109 lb)      Palliative Assessment/Data:  Flowsheet Rows        Most Recent Value   Intake Tab    Referral Department  Hospitalist   Unit at Time of Referral  Med/Surg Unit   Palliative Care Primary Diagnosis  Other (Comment)   Date Notified  09/18/15   Palliative Care Type  New Palliative care   Reason for referral  Non-pain Symptom, Pain, Counsel Regarding Hospice   Date of Admission  09/18/15   Date first seen by Palliative Care  09/19/15   # of days Palliative referral  response time  1 Day(s)   # of days IP prior to Palliative referral  0   Clinical Assessment    Palliative Performance Scale Score  30%   Pain Max last 24 hours  8   Pain Min Last 24 hours  1   Psychosocial & Spiritual Assessment    Palliative Care Outcomes    Patient/Family meeting held?  Yes   Who was at the meeting?  Celso Amy and Dominique Loth (Patient) along with care taker Spencer regarding hospice, Improved non-pain symptom therapy, Improved pain interventions      Additional Data Reviewed:  CBC:    Component Value Date/Time   WBC 8.4 09/18/2015 0640   HGB 11.1* 09/18/2015 0640   HCT 33.6* 09/18/2015 0640   PLT 270 09/18/2015 0640   MCV 90.1 09/18/2015 0640   NEUTROABS 3.9 09/05/2015 1444   LYMPHSABS 1.3 09/05/2015 1444   MONOABS 0.5 09/05/2015 1444   EOSABS 0.1 09/05/2015 1444   BASOSABS 0.0 09/05/2015 1444   Comprehensive Metabolic Panel:    Component Value Date/Time   NA 139 09/19/2015 0420   K 4.2 09/19/2015 0420   CL 107 09/19/2015 0420   CO2 24 09/19/2015 0420   BUN 17 09/19/2015 0420   CREATININE 0.77 09/19/2015 0420   CREATININE 0.74 11/11/2014 1646   GLUCOSE 130* 09/19/2015 0420   CALCIUM 8.4* 09/19/2015 0420   AST 23 09/17/2015 1800   ALT 9* 09/17/2015 1800   ALKPHOS 93 09/17/2015 1800   BILITOT 0.7 09/17/2015 1800   PROT 6.4* 09/17/2015 1800   ALBUMIN 3.2* 09/17/2015 1800     Time In: 9:00 Time Out: 10:10 Time Total: 70 min.  Greater than 50%  of this time was spent counseling and coordinating care related to the above assessment and plan.  Signed by:  Imogene Burn, PA-C Palliative Medicine Pager: [phone]    09/19/2015, 1:33 PM  Please contact Palliative Medicine Team phone at [phone] for questions and concerns.

## 2015-09-19 NOTE — Sedation Documentation (Signed)
825 cc aspirated.

## 2015-09-19 NOTE — Sedation Documentation (Signed)
Pt sleepy but responds to tactile and verbal stimulation well

## 2015-09-19 NOTE — Sedation Documentation (Signed)
Pt in [place] awaiting CT procedure

## 2015-09-20 DIAGNOSIS — R1900 Intra-abdominal and pelvic swelling, mass and lump, unspecified site: Secondary | ICD-10-CM

## 2015-09-20 DIAGNOSIS — Z515 Encounter for palliative care: Secondary | ICD-10-CM

## 2015-09-20 LAB — CBC
HEMATOCRIT: 30.4 % — AB (ref 36.0–46.0)
Hemoglobin: 9.5 g/dL — ABNORMAL LOW (ref 12.0–15.0)
MCH: 28.9 pg (ref 26.0–34.0)
MCHC: 31.3 g/dL (ref 30.0–36.0)
MCV: 92.4 fL (ref 78.0–100.0)
PLATELETS: 263 10*3/uL (ref 150–400)
RBC: 3.29 MIL/uL — AB (ref 3.87–5.11)
RDW: 14.2 % (ref 11.5–15.5)
WBC: 5.6 10*3/uL (ref 4.0–10.5)

## 2015-09-20 LAB — CA 125: CA 125: 1245 U/mL — AB (ref 0.0–38.1)

## 2015-09-20 MED ORDER — MORPHINE SULFATE (CONCENTRATE) 10 MG/0.5ML PO SOLN: 5.0000 mg | ORAL | Status: DC | PRN

## 2015-09-20 MED ORDER — LORAZEPAM 2 MG/ML PO CONC: 1.0000 mg | ORAL | Status: AC | PRN

## 2015-09-20 MED ORDER — LORAZEPAM 2 MG/ML PO CONC: 1.0000 mg | ORAL | Status: DC | PRN

## 2015-09-20 MED ORDER — MORPHINE SULFATE (CONCENTRATE) 10 MG/0.5ML PO SOLN: 5.0000 mg | ORAL | Status: AC | PRN

## 2015-09-20 MED ORDER — PAROXETINE HCL 10 MG PO TABS: ORAL_TABLET | ORAL | Status: AC

## 2015-09-20 NOTE — Discharge Summary (Addendum)
Physician Discharge Summary  Dominique Frank  [id number]  DOB: [birth year]  DOA: 09/17/2015  PCP: Elby Showers, MD  Admit date: 09/17/2015 Discharge date: 09/20/2015  Time spent: Greater than 30 minutes  Recommendations for Outpatient Follow-up:  1. Dr. Emeline General, PCP, as needed. Please follow fluid cytology results that were sent from the hospital. 2. [facility] will follow and provide home hospice services.  Discharge Diagnoses:  Principal Problem:   Pelvic mass in female Active Problems:   Dementia   Constipation   Glaucoma   Abdominal pain   Intestinal obstruction (HCC)   Ovarian mass   Ureteral obstruction, left   Palliative care encounter   Encounter for hospice care discussion   Discharge Condition: Improved & Stable  Diet recommendation: Regular diet.  Filed Weights   09/17/15 1732  Weight: 49.442 kg (109 lb)    History of present illness:  82 year old female patient with history of advanced dementia, HTN, HLD, presented to ED on 09/17/15 with multiple episodes of emesis and abdominal pain. She had been constipated for 2-3 weeks PTA and family tried to give her magnesium citrate on day of admission which caused worsening of symptoms. CT abdomen revealed large ovarian mass causing mass effect on colon and bladder. GYN oncology consulted and recommended IR to drain the cystic mass for comfort. Patient underwent CT-guided aspiration yielding 925 mL pale yellow fluid-cytology pending. Clinically improved. Discharged home with home hospice.  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 Npo on ivf. - Dr. Florencia Reasons, Hospitalist colleague has coordinated care on 3/16: She discussed with Dr. Everitt Amber, GYN oncology who recommended IR drain the cystic mass and send fluid for cytology. She indicated that this lesion may be benign or malignant and  even if malignant, would not recommend aggressive treatment given her advanced age and advanced dementia. - Per family, they do not wish aggressive treatment, but drain the cystic lesion to relieve her obstruction symptom will be ok.  Family is very supportive and understanding & they wished to talk to palliative team as well. They wanted the patient to be discharged home as soon as possible due to prior history of inhospital delirium if remain hospitalized too long.  - Patient underwent CT-guided aspiration of ovarian mass yielding 925 ML of fluid. Cytology to be followed as outpatient for a cadaveric purposes only. Patient not candidate for and family do not wish to pursue any aggressive treatment. Clinically improved with improved abdominal pain, tolerating diet and passing flatus. - As per patient's RN and son's report, hospice services seen patient in the hospital this morning and plan to follow as outpatient at home.  - Discussed with palliative care M.D. on call, okay to discharge home on Roxanol when necessary for pain and Ativan when necessary for anxiety.  - CEA [id number]. Markedly elevated suspicious for primary ovarian cancer.    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 This measures approximately 15 by 9.5 cm in greatest dimension. It causes mass effect upon the colon as previously described. It likely also causes compression of the distal left ureter causing the previously described dilatation of the collecting system on the left.  Advanced Dementia:  - at baseline  Anemia - Mild drop in hemoglobin from 11.1 > 9.5 in the absence of overt bleeding. No further evaluation or follow-up.  Elevated blood pressure  -  may have been precipitated by pain. Improved.   DO NOT RESUSCITATE    Consultants:  OB/GYN: Dr Elly Modena  GYN oncology: Dr. Denman George  Palliative care  Interventional radiology  Procedures:  CT-guided aspiration of left  ovarian cystic neoplasm  Antibiotics:  None    Discharge Exam:  Complaints:  Seen this morning. Mild intermittent lower abdominal pain but improved compared to prior to procedure. Tolerated diet last night (Cream of potato & gravy), passing flatus. Report given as per caregiver Deneise Lever May at bedside. As per RN, no acute issues.  Filed Vitals:   09/19/15 1706 09/19/15 1737 09/19/15 2132 09/20/15 0620  BP: 93/73 141/69 137/60 155/59  Pulse: 72 68 80 74  Temp: 98.7 F (37.1 C) 98.6 F (37 C) 98.5 F (36.9 C) 99.1 F (37.3 C)  TempSrc: Oral Oral Oral Oral  Resp: 16 16 17 18   Height:      Weight:      SpO2: 96%  94% 92%     General: Small built and frail pleasant elderly female lying comfortably supine in bed.  Cardiovascular: S1 and S2 heard, RRR. No JVD, murmurs or pedal edema.  Respiratory: Clear to auscultation. No increased work of breathing.  Abdomen: Mildly distended, soft and nontender. Suprapubic nonpulsatile mass felt. Distention and mass both appear less compared to yesterday.  Musculoskeletal: No Edema  Neuro: Alert and oriented to self. No focal deficits.  Discharge Instructions      Discharge Instructions    Call MD for:  difficulty breathing, headache or visual disturbances    Complete by:  As directed      Call MD for:  extreme fatigue    Complete by:  As directed      Call MD for:  persistant dizziness or light-headedness    Complete by:  As directed      Call MD for:  persistant nausea and vomiting    Complete by:  As directed      Call MD for:  severe uncontrolled pain    Complete by:  As directed      Call MD for:  temperature >100.4    Complete by:  As directed      Diet general    Complete by:  As directed      Increase activity slowly    Complete by:  As directed             Medication List    STOP taking these medications        saccharomyces boulardii 250 MG capsule  Commonly known as:  FLORASTOR      TAKE these medications         aspirin 81 MG EC tablet  Take 81 mg by mouth daily.     calcium carbonate 1250 (500 Ca) MG chewable tablet  Commonly known as:  OS-CAL  Chew 1 tablet by mouth daily.     COMBIGAN 0.2-0.5 % ophthalmic solution  Generic drug:  brimonidine-timolol  Place 1 drop into both eyes every 12 (twelve) hours.     LORazepam 2 MG/ML concentrated solution  Commonly known as:  ATIVAN  Take 0.5 mLs (1 mg total) by mouth every 4 (four) hours as needed for anxiety.     morphine CONCENTRATE 10 MG/0.5ML Soln concentrated solution  Take 0.25 mLs (5 mg total) by mouth every 3 (three) hours as needed for moderate pain, severe pain or shortness of breath.     omeprazole 20 MG capsule  Commonly known as:  PRILOSEC  Take 20 mg by mouth daily.     PARoxetine 10 MG tablet  Commonly known as:  PAXIL  TAKE 0.5 TABLET BY MOUTH DAILY     TRAVATAN Z 0.004 % Soln ophthalmic solution  Generic drug:  Travoprost (BAK Free)  Place 1 drop into both eyes at bedtime.       Follow-up Information    Schedule an appointment as soon as possible for a visit with Elby Showers, MD.   Specialty:  Internal Medicine   Why:  As needed, If symptoms worsen   Contact information:   [address] [place] [id number] [id number]       Get Medicines reviewed and adjusted: Please take all your medications with you for your next visit with your Primary MD  Please request your Primary MD to go over all hospital tests and procedure/radiological results at the follow up. Please ask your Primary MD to get all Hospital records sent to his/her office.  If you experience worsening of your admission symptoms, develop shortness of breath, life threatening emergency, suicidal or homicidal thoughts you must seek medical attention immediately by calling 911 or calling your MD immediately if symptoms less severe.  You must read complete instructions/literature along with all the possible adverse reactions/side  effects for all the Medicines you take and that have been prescribed to you. Take any new Medicines after you have completely understood and accept all the possible adverse reactions/side effects.   Do not drive when taking pain medications.   Do not take more than prescribed Pain, Sleep and Anxiety Medications  Special Instructions: If you have smoked or chewed Tobacco in the last 2 yrs please stop smoking, stop any regular Alcohol and or any Recreational drug use.  Wear Seat belts while driving.  Please note  You were cared for by a hospitalist during your hospital stay. Once you are discharged, your primary care physician will handle any further medical issues. Please note that NO REFILLS for any discharge medications will be authorized once you are discharged, as it is imperative that you return to your primary care physician (or establish a relationship with a primary care physician if you do not have one) for your aftercare needs so that they can reassess your need for medications and monitor your lab values.    The results of significant diagnostics from this hospitalization (including imaging, microbiology, ancillary and laboratory) are listed below for reference.    Significant Diagnostic Studies: Ct Abdomen Pelvis W Contrast  09/18/2015  ADDENDUM REPORT: 09/18/2015 08:08 ADDENDUM: Further clinical information is been given following placement of a Foley catheter. Only a small amount of urine was withdrawn. Further evaluation of the imaging shows that the previously described over distended bladder represents a likely left ovarian cystic neoplasm with peripheral soft tissue nodularity. This measures approximately 15 by 9.5 cm in greatest dimension. It causes mass effect upon the colon as previously described. It likely also causes compression of the distal left ureter causing the previously described dilatation of the collecting system on the left. Electronically Signed   By: Inez Catalina M.D.   On: 09/18/2015 08:08  09/18/2015  CLINICAL DATA:  Bloating and vomiting EXAM: CT ABDOMEN AND PELVIS WITH CONTRAST TECHNIQUE: Multidetector CT imaging of the abdomen and pelvis was performed using the standard protocol following bolus administration of intravenous contrast. CONTRAST:  57mL OMNIPAQUE IOHEXOL 300 MG/ML  SOLN COMPARISON:  None. FINDINGS: Lung bases demonstrate a small left-sided pleural effusion. No focal  infiltrate is noted. Coronary calcifications are seen as well as a moderate-sized hiatal hernia. The liver and gallbladder as well as the spleen are within normal limits. The adrenal glands and pancreas are within normal limits. The kidneys show bilateral cystic change as well as fullness of the left collecting system. This is likely related to a an overly distended bladder as no definitive stone is seen. The bladder demonstrates some increased enhancing changes particularly on the left suspicious for underlying bladder mass. Further evaluation is recommended. The uterus has been surgically removed. Gaseous distension of the colon is noted as well as some fecal material throughout the colon. No true obstructive changes are seen although significant mass-effect upon the rectosigmoid is noted by the distended bladder. This may be causing a somewhat obstructive pattern. The osseous structures show healed pubic rami fractures as well as generalized osteopenia. Compression deformities are noted at T11 and L1 and to a lesser degree at L3. These all appear chronic in nature. IMPRESSION: Significantly over distended bladder with subsequent dilatation of the left collecting system. There are some enhancing areas within the left half of the bladder which are suspicious for underlying mass lesion. Further evaluation is recommended. The bladder causes some mass-effect upon the rectosigmoid causing some gaseous distention and fecal material within the transverse colon and left colon. Moderate hiatal  hernia. Renal cystic changes. Small left pleural effusion. Electronically Signed: By: Inez Catalina M.D. On: 09/17/2015 21:51   Dg Abd 2 Views  09/05/2015  CLINICAL DATA:  RLQ pain x weeks / no reg BMs / concern for SBO. EXAM: ABDOMEN - 2 VIEW COMPARISON:  07/03/2015 FINDINGS: No evidence of bowel obstruction or significant adynamic ileus. There is no free air. Moderate increased stool is noted in the colon. Soft tissues are unremarkable. There are multiple old compression fractures of the visualized spine. There are old fractures of left superior inferior pubic rami. Bones are diffusely demineralized. IMPRESSION: 1. No acute findings.  No evidence of bowel obstruction. 2. Moderate increased stool in the colon. Electronically Signed   By: Lajean Manes M.D.   On: 09/05/2015 15:26   Ct Image Guided Fluid Drain By Catheter  09/20/2015  CLINICAL DATA:  Large complex cystic pelvic mass. EXAM: CT GUIDED ASPIRATION BIOPSY OF PELVIC MASS ANESTHESIA/SEDATION: Intravenous Fentanyl and Versed were administered as conscious sedation during continuous monitoring of the patient's level of consciousness and physiological / cardiorespiratory status by the radiology RN, with a total moderate sedation time of 13 minutes. PROCEDURE: The procedure risks, benefits, and alternatives were explained to the patient and family. Questions regarding the procedure were encouraged and answered. The family understands and consents to the procedure. Select axial scans through the pelvis were obtained. The collection was localized and an appropriate skin entry site was determined and marked. The operative field was prepped with chlorhexidinein a sterile fashion, and a sterile drape was applied covering the operative field. A sterile gown and sterile gloves were used for the procedure. Local anesthesia was provided with 1% Lidocaine. Under CT fluoroscopic guidance, a 15 cm multi side hole Yueh sheath needle was advanced into the collection.  89mL thin pale yellow fluid were aspirated. Follow-up scan shows significant decompression of the lesion with small residual loculated fluid collections persisting. The aspirate was sent for cytology as requested. The patient tolerated the procedure well. COMPLICATIONS: None immediate FINDINGS: The large complex cystic pelvic mass was again localized. 825 mL fluid were aspirated, sent for cytology. IMPRESSION: 1. Technically successful CT-guided aspiration  of the complex cystic pelvic mass. Electronically Signed   By: Lucrezia Europe M.D.   On: 09/20/2015 09:49    Microbiology: No results found for this or any previous visit (from the past 240 hour(s)).   Labs: Basic Metabolic Panel:  Recent Labs Lab 09/17/15 1800 09/18/15 0640 09/19/15 0420  NA 138 137 139  K 3.8 3.9 4.2  CL 101 102 107  CO2 24 26 24   GLUCOSE 108* 105* 130*  BUN 12 12 17   CREATININE 0.71 0.65 0.77  CALCIUM 9.2 8.5* 8.4*  MG  --   --  2.3   Liver Function Tests:  Recent Labs Lab 09/17/15 1800  AST 23  ALT 9*  ALKPHOS 93  BILITOT 0.7  PROT 6.4*  ALBUMIN 3.2*    Recent Labs Lab 09/17/15 1800  LIPASE 31   No results for input(s): AMMONIA in the last 168 hours. CBC:  Recent Labs Lab 09/17/15 1800 09/18/15 0640 09/20/15 0551  WBC 8.5 8.4 5.6  HGB 12.7 11.1* 9.5*  HCT 39.6 33.6* 30.4*  MCV 90.6 90.1 92.4  PLT 331 270 263   Cardiac Enzymes: No results for input(s): CKTOTAL, CKMB, CKMBINDEX, TROPONINI in the last 168 hours. BNP: BNP (last 3 results) No results for input(s): BNP in the last 8760 hours.  ProBNP (last 3 results) No results for input(s): PROBNP in the last 8760 hours.  CBG: No results for input(s): GLUCAP in the last 168 hours.   Discussed with Mr. Tylia Gilliand, son at bedside. Updated care and answered questions. Discussed with [facility] and Palliative team.  Signed:  Vernell Leep, MD, FACP, FHM. [facility] Pager [phone]  If 7PM-7AM, please contact  night-coverage [email or web address] Password [email or web address] 09/20/2015, 11:22 AM

## 2015-09-20 NOTE — Progress Notes (Signed)
Daily Progress Note   Patient Name: Dominique Frank       Date: 09/20/2015 DOB: [birth year]  Age: 82 y.o. MRN#: [id number] Attending Physician: Dominique Jansky, MD Primary Care Physician: Dominique Showers, MD Admit Date: 09/17/2015  Reason for Consultation/Follow-up: Hospice Evaluation, Non pain symptom management and Pain control  Subjective: Patient went for drainage of ovarian cyst on 09/19/2015, 825 cc were aspirated. Patient is planned for discharge today to go home with hospice. Met with her son Dominique Frank and verified that this was the plan. Patient had been on IV fentanyl in the hospital and Dr. Janifer Frank change this to oral morphine which could be administered at home by hospice and the family. Verified with hospice that she has been admitted to their service and they will be visiting with patient in the next 24-36 hours. Interval Events: Drainage of ovarian mass of 825 cc Length of Stay: 2 days  Current Medications: Scheduled Meds:  . antiseptic oral rinse  7 mL Mouth Rinse q12n4p  . brimonidine  1 drop Both Eyes BID  . chlorhexidine  15 mL Mouth Rinse BID  . enoxaparin (LOVENOX) injection  30 mg Subcutaneous Q24H  . latanoprost  1 drop Both Eyes QHS  . timolol  1 drop Both Eyes BID    Continuous Infusions: . dextrose 5 % and 0.9 % NaCl with KCl 20 mEq/L 50 mL/hr at 09/19/15 1108    PRN Meds: acetaminophen **OR** acetaminophen, hydrALAZINE, levalbuterol, LORazepam, morphine CONCENTRATE, ondansetron **OR** ondansetron (ZOFRAN) IV, promethazine  Physical Exam: Physical Exam  Constitutional:  Frail elderly acutely ill appearing female  HENT:  Head: Normocephalic and atraumatic.  Pulmonary/Chest: Effort normal.  Neurological:  somnalent  Skin:  cool                Vital  Signs: BP 155/59 mmHg  Pulse 74  Temp(Src) 99.1 F (37.3 C) (Oral)  Resp 18  Ht 5' (1.524 m)  Wt 49.442 kg (109 lb)  BMI 21.29 kg/m2  SpO2 92% SpO2: SpO2: 92 % O2 Device: O2 Device: Not Delivered O2 Flow Rate: O2 Flow Rate (L/min): 2 L/min  Intake/output summary:  Intake/Output Summary (Last 24 hours) at 09/20/15 1243 Last data filed at 09/20/15 0620  Gross per 24 hour  Intake      0 ml  Output  920 ml  Net   -920 ml   LBM: Last BM Date: 09/14/15 Baseline Weight: Weight: 49.442 kg (109 lb) Most recent weight: Weight: 49.442 kg (109 lb)       Palliative Assessment/Data: Flowsheet Rows        Most Recent Value   Intake Tab    Referral Department  Hospitalist   Unit at Time of Referral  Med/Surg Unit   Palliative Care Primary Diagnosis  Other (Comment)   Date Notified  09/18/15   Palliative Care Type  New Palliative care   Reason for referral  Non-pain Symptom, Pain, Counsel Regarding Hospice   Date of Admission  09/18/15   Date first seen by Palliative Care  09/19/15   # of days Palliative referral response time  1 Day(s)   # of days IP prior to Palliative referral  0   Clinical Assessment    Palliative Performance Scale Score  30%   Pain Max last 24 hours  8   Pain Min Last 24 hours  1   Psychosocial & Spiritual Assessment    Palliative Care Outcomes    Patient/Family meeting held?  Yes   Who was at the meeting?  Dominique Frank and Dominique Frank (Patient) along with care taker Dominique Frank regarding hospice, Improved non-pain symptom therapy, Improved pain interventions      Additional Data Reviewed: CBC    Component Value Date/Time   WBC 5.6 09/20/2015 0551   RBC 3.29* 09/20/2015 0551   HGB 9.5* 09/20/2015 0551   HCT 30.4* 09/20/2015 0551   PLT 263 09/20/2015 0551   MCV 92.4 09/20/2015 0551   MCH 28.9 09/20/2015 0551   MCHC 31.3 09/20/2015 0551   RDW 14.2 09/20/2015 0551   LYMPHSABS 1.3 09/05/2015 1444   MONOABS 0.5  09/05/2015 1444   EOSABS 0.1 09/05/2015 1444   BASOSABS 0.0 09/05/2015 1444    CMP     Component Value Date/Time   NA 139 09/19/2015 0420   K 4.2 09/19/2015 0420   CL 107 09/19/2015 0420   CO2 24 09/19/2015 0420   GLUCOSE 130* 09/19/2015 0420   BUN 17 09/19/2015 0420   CREATININE 0.77 09/19/2015 0420   CREATININE 0.74 11/11/2014 1646   CALCIUM 8.4* 09/19/2015 0420   PROT 6.4* 09/17/2015 1800   ALBUMIN 3.2* 09/17/2015 1800   AST 23 09/17/2015 1800   ALT 9* 09/17/2015 1800   ALKPHOS 93 09/17/2015 1800   BILITOT 0.7 09/17/2015 1800   GFRNONAA >60 09/19/2015 0420   GFRAA >60 09/19/2015 0420       Problem List:  Patient Active Problem List   Diagnosis Date Noted  . Palliative care encounter   . Encounter for hospice care discussion   . Abdominal pain 09/18/2015  . Pelvic mass in female 09/18/2015  . Intestinal obstruction ([place])   . Ovarian mass   . Ureteral obstruction, left   . Fall 04/23/2014  . Hyponatremia 04/23/2014  . History of bowel resection 08/16/2013  . Hearing loss 07/09/2013  . Decreased appetite 04/23/2012  . Dementia 11/24/2010  . HTN (hypertension) 11/24/2010  . Constipation 11/24/2010  . Glaucoma 11/24/2010     Palliative Care Assessment & Plan    1.Code Status:  DNR    Code Status Orders        Start     Ordered   09/18/15 0234  Do not attempt resuscitation (DNR)   Continuous    Question Answer Comment  In the event of cardiac or respiratory ARREST Do not call a "code blue"   In the event of cardiac or respiratory ARREST Do not perform Intubation, CPR, defibrillation or ACLS   In the event of cardiac or respiratory ARREST Use medication by any route, position, wound care, and other measures to relive pain and suffering. May use oxygen, suction and manual treatment of airway obstruction as needed for comfort.      09/18/15 0236    Code Status History    Date Active Date Inactive Code Status Order ID Comments User Context    04/03/2012  7:59 PM 04/05/2012  3:17 PM Full Code [id number]  Dominique Sake Columbres, RN Inpatient       2. Goals of Care/Additional Recommendations:  Educated family on the importance of monitoring for urinary output. If patient has not urinated at home this evening, educated to call hospice on-call to replace the Foley catheter  Son given prescriptions for liquid morphine as well as liquid Ativan and he will have this filled today  Family feels prepared to take her home  Limitations on Scope of Treatment: Full Comfort Care  Desire for further Chaplaincy support:no  Psycho-social Needs: Grief/Bereavement Support  3. Symptom Management:      1. Pain: Morphine concentrate 5 mg every 3 hours as needed for pain  4. Palliative Prophylaxis:   Delirium Protocol, Frequent Pain Assessment, Oral Care and Turn Reposition  5. Prognosis: Given patient's advanced age the acuteness of her illness, I would not be surprised if she only had a prognosis of days to weeks  6. Discharge Planning:  Home with Hospice   Care plan was discussed with [facility]  Thank you for allowing the Palliative Medicine Team to assist in the care of this patient.   Time In: 1030 Time Out: 1100 Total Time 30 min Prolonged Time Billed  no         Dory Horn, NP  09/20/2015, 12:43 PM  Please contact Palliative Medicine Team phone at [phone] for questions and concerns.

## 2015-09-20 NOTE — Discharge Instructions (Signed)
Ovarian Tumors The ovaries are small organs that produce eggs in women. They lie on each side of the uterus. Tumors are solid growths on the ovary, not like ovarian cysts that are filled with fluid. They can be cancerous or noncancerous. All solid tumors should be looked at to make sure they are not cancerous tumors.  RISK FACTORS There are no known causes for ovarian tumors. However, there are several risk factors for developing cancerous tumors on the ovary, such as:  Age.  [place] or [place] descent.  Personal or family history of ovarian, colon, or breast cancer.  Presence of BRCA1 or BRCA2 genes.  Use of fertility medicines.  Late menopause (older than 50 years).  Pregnancy for the first time at age 33 years or older. SIGNS AND SYMPTOMS  In many cases there are no symptoms. Noncancerous tumors usually have no symptoms, but cancerous tumors may have symptoms that are minor and resemble other health problems. The following are symptoms of ovarian tumors:  Unexplained weight loss.  Increased abdominal size.  Belly (abdominal) pain.  Back or pelvis pain or pressure.  Tiredness.  Abnormal vaginal bleeding.  Loss of appetite.  Frequent urination or pressure on your bladder.  Indigestion, increased gas, and bloating.  Painful sexual intercourse. DIAGNOSIS  The following test may be done to help diagnose ovarian tumors:  An ultrasound exam.  Imaging exams, such as an X-ray exam, CT scan, or MRI.  Blood tests. TREATMENT   The tumor will be studied in the lab under a microscope to see if it is cancer.  Noncancerous tumors can be removed surgically with or without removing the ovary.  Cancerous tumors usually are removed with the ovary and sometimes both ovaries are removed with the fallopian tubes, uterus, and surrounding lymph nodes to see if the cancer has spread.  Cancerous tumors may also be treated along with surgery and radiation,  chemotherapy, or both. HOME CARE INSTRUCTIONS   Follow your health care provider's advice and recommendations regarding medicine and follow-up care.  Get a yearly physical and gynecology exam. This includes a pelvic exam if you are aged 80 years or older. SEEK MEDICAL CARE IF:   You have any of the above symptoms that have not gone away after a week of treatment.  You are losing weight for no reason.  You feel generally ill.   This information is not intended to replace advice given to you by your health care provider. Make sure you discuss any questions you have with your health care provider.   Document Released: 03/30/2008 Document Revised: 04/11/2013 Document Reviewed: 01/18/2013 Elsevier Interactive Patient Education [employer].

## 2015-09-20 NOTE — Progress Notes (Signed)
Notified by Romona Curls of PCT of family request for [facility]osp[facility] and [facility] services at home after discharge. Chart and patient information currently under review to confirm hospice eligibility.  Spoke with the patients son Leane Call at bedside to initiate education related to hospice philosophy, services and team approach to care. Family verbalized understanding of the information provided. Per discussion plan is for discharge to home by personal vehicle with son and daughter today.  Please send signed completed DNR form home with patient.  Patient will need prescriptions for discharge comfort medications.  DME needs discussed and none needed at this time. Patient has a w/c and BSC in the home already.   The home address has been verified and is correct in the chart;.  [facility] aware of the above.  Completed discharge summary will need to be faxed to [facility] at [phone] when final.  Please notify [facility] when patient is ready to leave unit at discharge-call [phone].  [facility] information and contact numbers have been given to [facility] during visit.  Above information shared with Romona Curls, CNP   Please call with any questions.   Mickie Kay, RN,  [facility]  [phone]

## 2015-09-20 NOTE — Progress Notes (Signed)
Discharge home. Home discharge instruction given. Hospice called and aware of the discharge . Hospice to visit patient in am for admission.

## 2015-09-25 ENCOUNTER — Telehealth: Payer: Self-pay | Admitting: Gynecologic Oncology

## 2015-09-25 NOTE — Telephone Encounter (Signed)
Informed son, Leane Call, that the Ms Blumberg does in fact have ovarian cancer based on her biopsy result.  Patient has hospice in place and therefore intervention is not appropriate. Discussed this with son who feels comfortable with plan for no intervention. Provided son with our office phone number.  Donaciano Maiya, MD

## 2015-11-03 DEATH — deceased
